# Patient Record
Sex: Female | Born: 1975
Health system: Southern US, Community
[De-identification: ages and names within clinical notes are randomized; demographics above are authoritative.]

## PROBLEM LIST (undated history)

## (undated) DIAGNOSIS — Z789 Other specified health status: Secondary | ICD-10-CM

## (undated) HISTORY — DX: Other specified health status: Z78.9

---

## 2001-01-03 HISTORY — PX: TUBAL LIGATION: SHX77

## 2005-10-31 ENCOUNTER — Emergency Department: Payer: Self-pay | Admitting: Emergency Medicine

## 2008-04-14 IMAGING — CR DG CHEST 2V
1 series · 2 of 2 positions shown · non-contrast
Comparison: none

REASON FOR EXAM: Cough
COMMENTS:

[Series 1: view not recorded · 0.17mm/px · 2 of 2 slices shown]
[im 1/2]
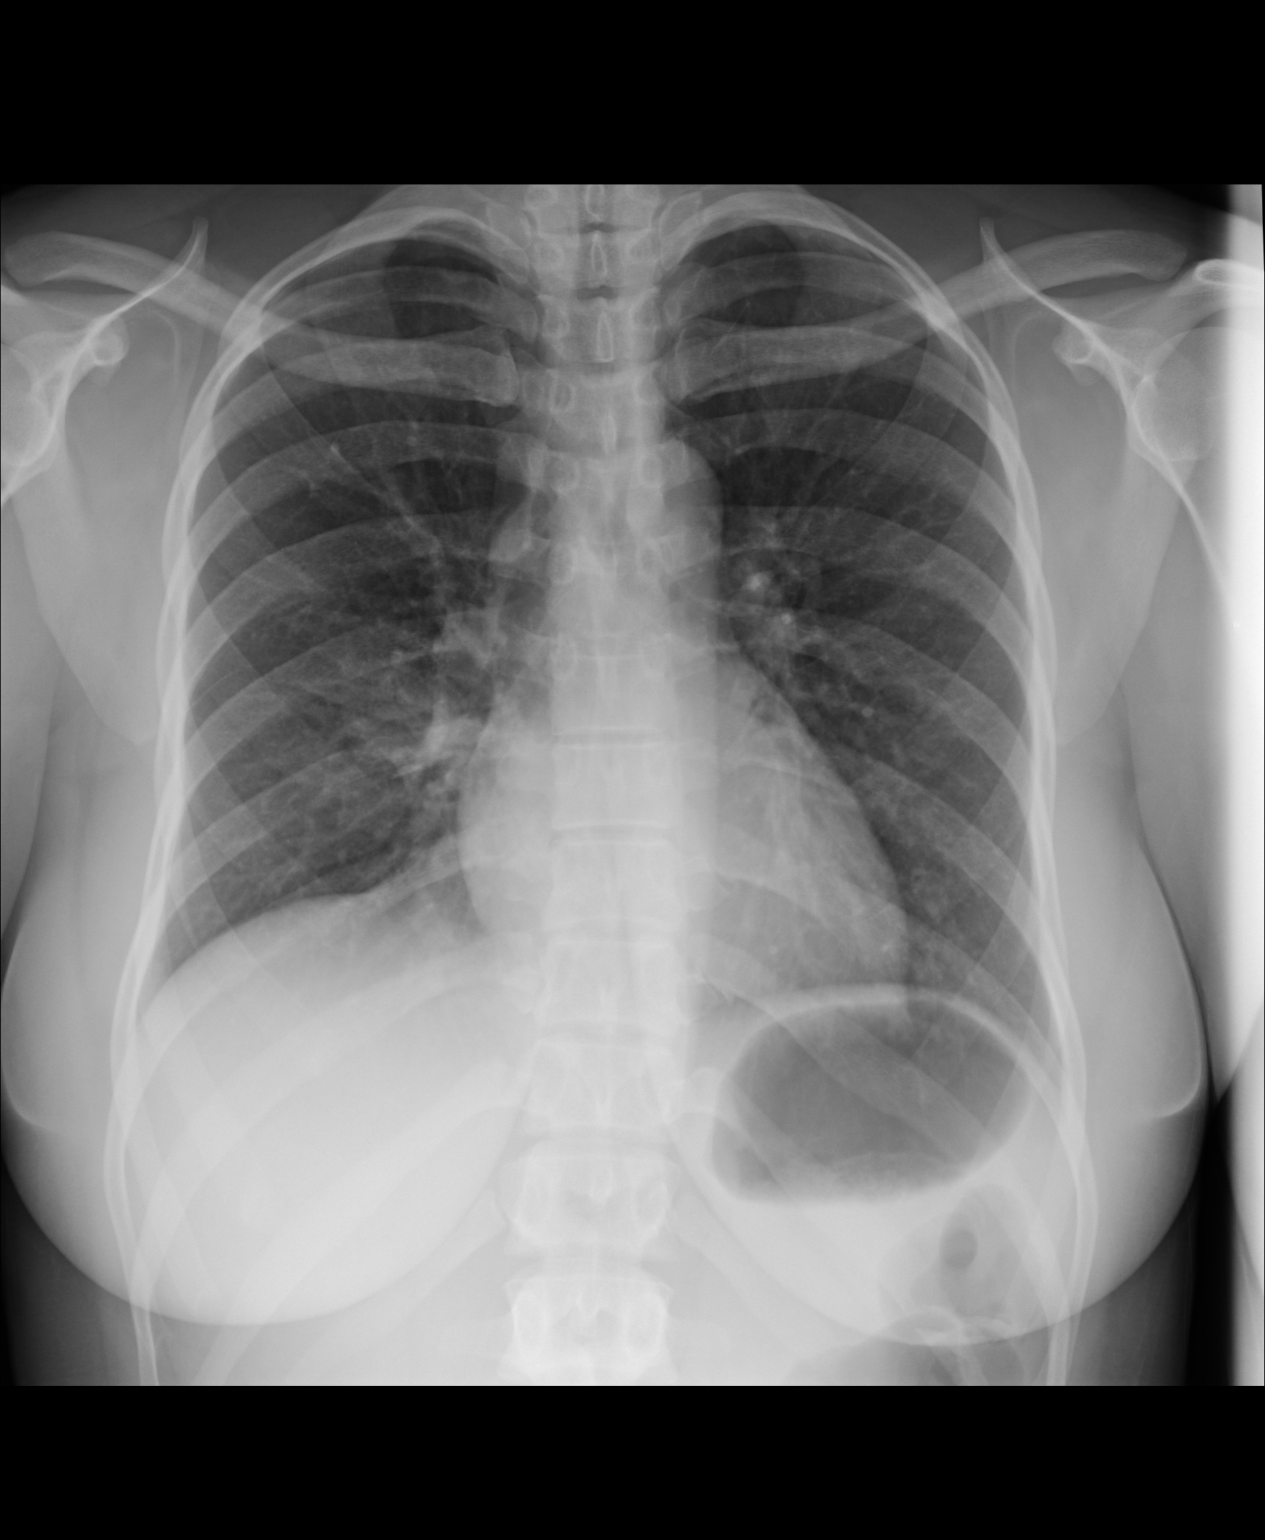
[im 2/2]
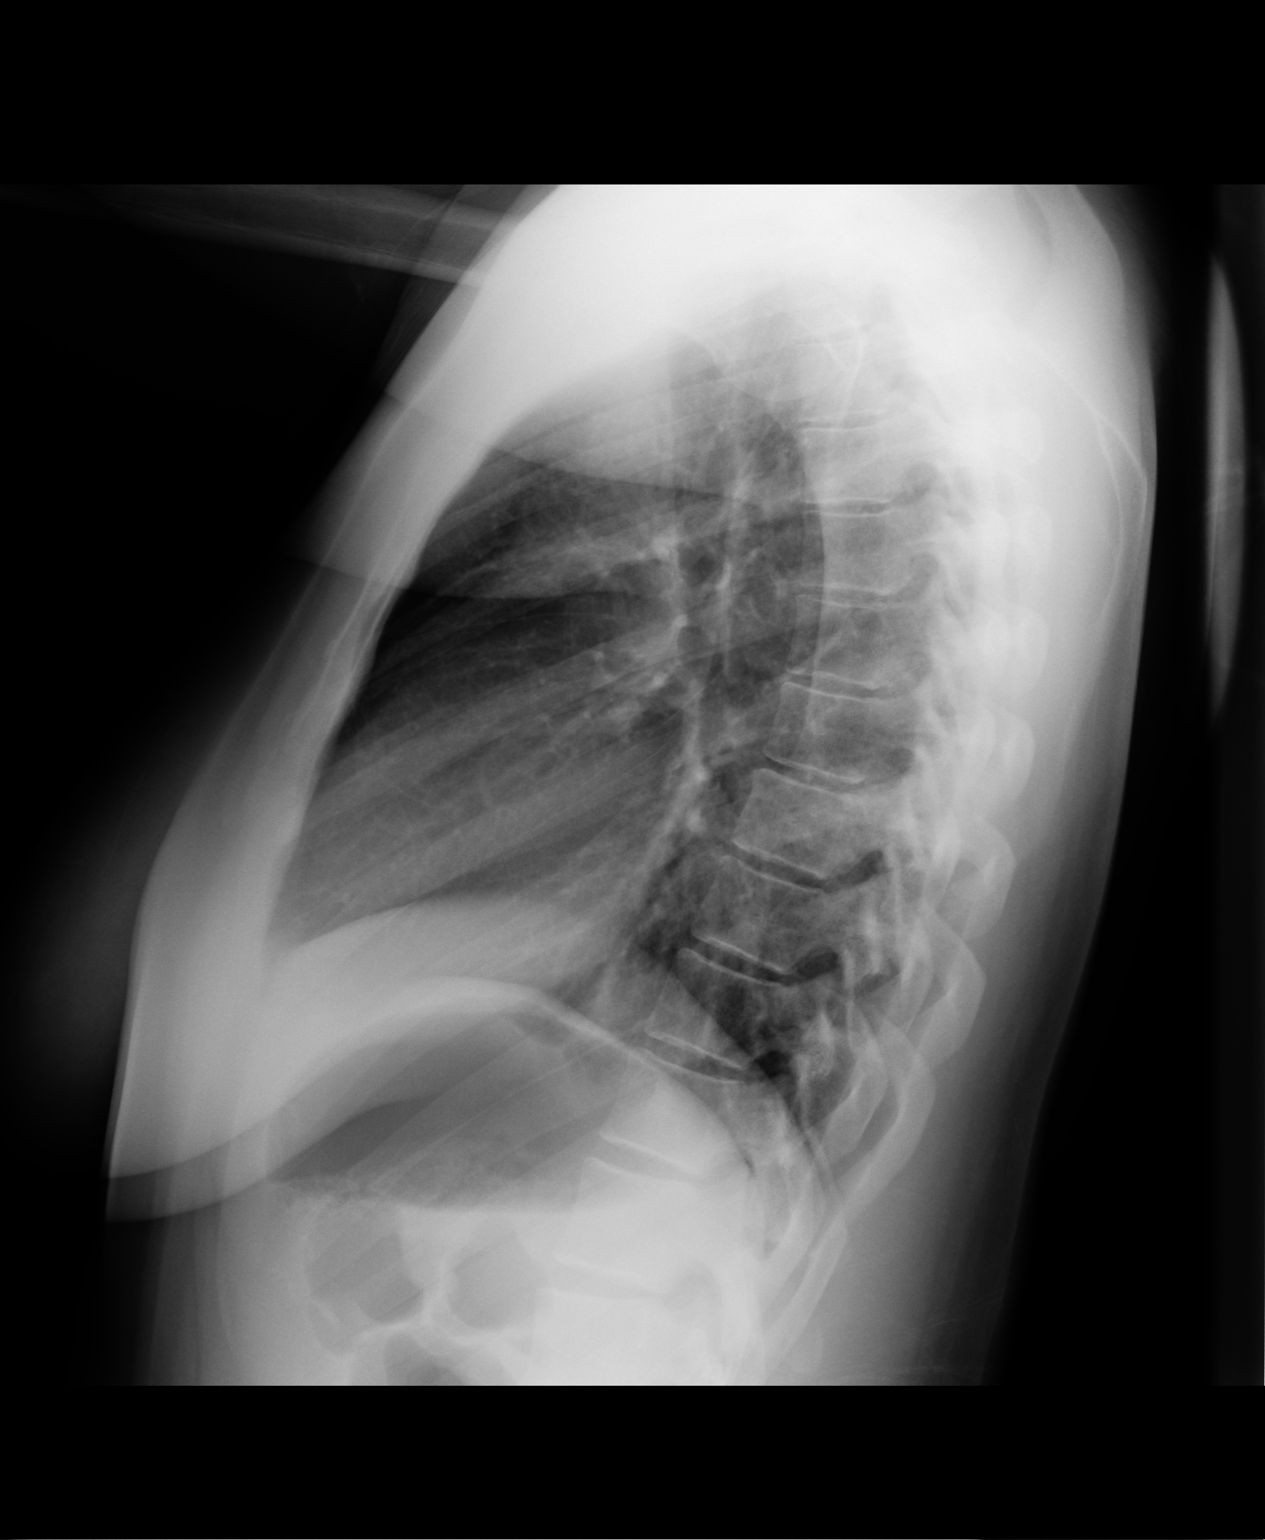

[2 of 2 positions shown; findings below may reference images not displayed]

PROCEDURE:     DXR - DXR CHEST PA (OR AP) AND LATERAL  - October 31, 2005  [DATE]

RESULT:     The patient has taken a shallow inspiration. With technique
taken into consideration, there is no evidence of focal infiltrates,
effusions or edema. The cardiac silhouette and visualized bony skeleton are
unremarkable.
IMPRESSION: Chest radiograph without evidence of acute cardiopulmonary
disease.

## 2008-12-05 ENCOUNTER — Emergency Department: Payer: Self-pay | Admitting: Emergency Medicine

## 2011-06-07 ENCOUNTER — Emergency Department: Payer: Self-pay | Admitting: Emergency Medicine

## 2012-06-03 ENCOUNTER — Emergency Department: Payer: Self-pay | Admitting: Emergency Medicine

## 2015-10-04 HISTORY — PX: OTHER SURGICAL HISTORY: SHX169

## 2016-01-04 NOTE — L&D Delivery Note (Signed)
Date of delivery: 09/08/2016 Estimated Date of Delivery: 09/06/16 Patient's last menstrual period was 11/15/2015 (approximate). EGA: 2725w2d  Delivery Note At 11:37 AM a viable female was delivered via Vaginal, Spontaneous Delivery (Presentation: OA;  LOA).  APGAR: 2, 9; weight 7 lb 13.6 oz (3560 g).   Placenta status: spontaneous, intact.  Cord:  with the following complications: none.  Cord pH: NA  Called to see patient.  Mom pushed to deliver a viable female infant.  The head followed by shoulders, with a 1 minute dystocia delivered with McRoberts and supra pubic pressure, and the rest of the body.  Nuchal cord noted and reduced following delivery of the body.  Baby to mom's chest. Baby appeared to be stunned following delivery. Stimulation and cord clamped and cut after < 1 min delay. Nursery MD called to room for newborn transition.  Cord blood obtained.  Placenta delivered spontaneously, intact, with a 3-vessel cord.  No lacerations.  All counts correct.  Hemostasis obtained with IV pitocin and fundal massage.   Anesthesia:  none Episiotomy:  none Lacerations: none  Suture Repair: NA Est. Blood Loss (mL):  250  Mom to postpartum.  Baby to Couplet care / Skin to Skin.  Tresea MallJane Brandi Tomlinson, CNM 09/08/2016, 12:02 PM

## 2016-03-18 ENCOUNTER — Encounter: Payer: Self-pay | Admitting: Advanced Practice Midwife

## 2016-03-18 ENCOUNTER — Ambulatory Visit (INDEPENDENT_AMBULATORY_CARE_PROVIDER_SITE_OTHER): Payer: Managed Care, Other (non HMO) | Admitting: Advanced Practice Midwife

## 2016-03-18 ENCOUNTER — Other Ambulatory Visit: Payer: Managed Care, Other (non HMO)

## 2016-03-18 VITALS — BP 102/62 | Ht 66.0 in | Wt 208.0 lb

## 2016-03-18 DIAGNOSIS — Z6831 Body mass index (BMI) 31.0-31.9, adult: Secondary | ICD-10-CM

## 2016-03-18 DIAGNOSIS — O99210 Obesity complicating pregnancy, unspecified trimester: Secondary | ICD-10-CM

## 2016-03-18 DIAGNOSIS — Z1379 Encounter for other screening for genetic and chromosomal anomalies: Secondary | ICD-10-CM

## 2016-03-18 DIAGNOSIS — Z113 Encounter for screening for infections with a predominantly sexual mode of transmission: Secondary | ICD-10-CM

## 2016-03-18 DIAGNOSIS — O09529 Supervision of elderly multigravida, unspecified trimester: Secondary | ICD-10-CM | POA: Insufficient documentation

## 2016-03-18 DIAGNOSIS — Z131 Encounter for screening for diabetes mellitus: Secondary | ICD-10-CM

## 2016-03-18 DIAGNOSIS — Z3A15 15 weeks gestation of pregnancy: Secondary | ICD-10-CM

## 2016-03-18 DIAGNOSIS — O0990 Supervision of high risk pregnancy, unspecified, unspecified trimester: Secondary | ICD-10-CM

## 2016-03-18 DIAGNOSIS — Z124 Encounter for screening for malignant neoplasm of cervix: Secondary | ICD-10-CM

## 2016-03-18 LAB — HM PAP SMEAR: HM Pap smear: NEGATIVE

## 2016-03-18 NOTE — Patient Instructions (Signed)

## 2016-03-18 NOTE — Progress Notes (Signed)
New Obstetric Patient H&P    Chief Complaint: "Desires prenatal care"   History of Present Illness: Patient is a 41 y.o. [id number] Not Hispanic or Latino female, LMP 11/15/2015 presents with amenorrhea and positive home pregnancy test. Based on early U/S, her EDD is Estimated Date of Delivery: 09/06/16 and her EGA is [redacted]w[redacted]d. Cycles are 3. days, regular, and occur approximately every : 28 days. Her last pap smear was 2 years ago and was no abnormalities. The patient had a reversal procedure of her tubal sterilization in October of last year and then conceived the following month. She had early care with the reproductive clinic in [place] where they confirmed the intrauterine pregnancy.   She had a urine pregnancy test which was positive 3 month(s)  ago. Her last menstrual period was normal and lasted for  3 day(s). Since her LMP she claims she has experienced fatigue and nausea. She denies vaginal bleeding. Her past medical history is noncontributory. Her prior pregnancies are notable for none  Since her LMP, she admits to the use of tobacco products  no She claims she has gained   yes, started at 195 pounds since the start of her pregnancy.  There are cats in the home in the home  no  She admits close contact with children on a regular basis  yes  She has had chicken pox in the past yes She has had Tuberculosis exposures, symptoms, or previously tested positive for TB   no Current or past history of domestic violence. no  Genetic Screening/Teratology Counseling: (Includes patient, baby's father, or anyone in either family with:)   1. Patient's age >/= 80 at [facility]  yes 2. Thalassemia ([place], [place], Mediterranean, or Asian background): MCV<80  no 3. Neural tube defect (meningomyelocele, spina bifida, anencephaly)  no 4. Congenital heart defect  no  5. Down syndrome  Yes, cousin's child with Down S. 6. Tay-Sachs (Jewish, [place]  no 7. Canavan's Disease  no 8. Sickle cell disease  or trait (African)  no  9. Hemophilia or other blood disorders  no  10. Muscular dystrophy  no  11. Cystic fibrosis  no  12. Huntington's Chorea  no  13. Mental retardation/autism  no 14. Other inherited genetic or chromosomal disorder  no 15. Maternal metabolic disorder (DM, PKU, etc)  no 16. Patient or FOB with a child with a birth defect not listed above no  16a. Patient or FOB with a birth defect themselves no 17. Recurrent pregnancy loss, or stillbirth  no  18. Any medications since LMP other than prenatal vitamins (include vitamins, supplements, OTC meds, drugs, alcohol)  no 19. Any other genetic/environmental exposure to discuss  no  Infection History:   1. Lives with someone with TB or TB exposed  no  2. Patient or partner has history of genital herpes  no 3. Rash or viral illness since LMP  no 4. History of STI (GC, CT, HPV, syphilis, HIV)  no 5. History of recent travel :  no  Other pertinent information:  no     Review of Systems:10 point review of systems negative unless otherwise noted in HPI  Past Medical History:  History reviewed. No pertinent past medical history.  Past Surgical History:  Past Surgical History:  Procedure Laterality Date  . TUBAL LIGATION  2003  . tubal reversal  10/2015    Gynecologic History: No LMP recorded. Patient is pregnant.  Obstetric History: [id number]  Family History:  History  reviewed. No pertinent family history.  Social History:  Social History   Social History  . Marital status: Single    Spouse name: N/A  . Number of children: N/A  . Years of education: N/A   Occupational History  . Not on file.   Social History Main Topics  . Smoking status: Former Games developermoker  . Smokeless tobacco: Never Used  . Alcohol use Yes     Comment: Social  . Drug use: No  . Sexual activity: Yes    Birth control/ protection: None   Other Topics Concern  . Not on file   Social History Narrative  . No narrative on file     Allergies:  Allergies not on file  Medications: Prior to Admission medications   Not on File    Physical Exam Vitals: Blood pressure 102/62, height 5\' 6"  (1.676 m), weight 208 lb (94.3 kg).  General: NAD HEENT: normocephalic, anicteric Thyroid: no enlargement, no palpable nodules Pulmonary: No increased work of breathing, CTAB Cardiovascular: RRR, distal pulses 2+ Abdomen: NABS, soft, non-tender, non-distended.  Umbilicus without lesions.  No hepatomegaly, splenomegaly or masses palpable. No evidence of hernia  Genitourinary:  External: Normal external female genitalia.  Normal urethral meatus, normal  Bartholin's and Skene's glands.    Vagina: Normal vaginal mucosa, no evidence of prolapse.    Cervix: Grossly normal in appearance, no bleeding  Uterus: Enlarged, mobile, normal contour.  No CMT  Adnexa: ovaries non-enlarged, no adnexal masses  Rectal: deferred Extremities: no edema, erythema, or tenderness Neurologic: Grossly intact Psychiatric: mood appropriate, affect full   Assessment: 41 y.o. G4P3003 at 5550w3d presenting to initiate prenatal care  Plan: 1) Avoid alcoholic beverages. 2) Patient encouraged not to smoke.  3) Discontinue the use of all non-medicinal drugs and chemicals.  4) Take prenatal vitamins daily.  5) Nutrition, food safety (fish, cheese advisories, and high nitrite foods) and exercise discussed. 6) Hospital and practice style discussed with cross coverage system.  7) Genetic Screening, such as with 1st Trimester Screening, cell free fetal DNA, AFP testing, and Ultrasound, as well as with amniocentesis and CVS as appropriate, is discussed with patient. At the conclusion of today's visit patient requested genetic testing 8) Patient is asked about travel to areas at risk for the Zika virus, and counseled to avoid travel and exposure to mosquitoes or sexual partners who may have themselves been exposed to the virus. Testing is discussed, and will be  ordered as appropriate.   9) Prenatal labs done today including NOB panel, sickle cell, early 1 hr gtt, Paptima, Urine Cx, Panorama genetic screen 10) Return in 4 weeks for Anatomy scan and ROB   Tresea MallJane Daron Breeding, CNM

## 2016-03-20 LAB — URINE CULTURE

## 2016-03-21 LAB — RPR+RH+ABO+RUB AB+AB SCR+CB...
ANTIBODY SCREEN: NEGATIVE
HEMOGLOBIN: 12.2 g/dL (ref 11.1–15.9)
HIV Screen 4th Generation wRfx: NONREACTIVE
Hematocrit: 37.6 % (ref 34.0–46.6)
Hepatitis B Surface Ag: NEGATIVE
MCH: 27.1 pg (ref 26.6–33.0)
MCHC: 32.4 g/dL (ref 31.5–35.7)
MCV: 84 fL (ref 79–97)
Platelets: 321 10*3/uL (ref 150–379)
RBC: 4.5 x10E6/uL (ref 3.77–5.28)
RDW: 13.4 % (ref 12.3–15.4)
RPR Ser Ql: NONREACTIVE
Rh Factor: POSITIVE
Rubella Antibodies, IGG: 6.72 index (ref >0.99)
Varicella zoster IgG: 262 index (ref >165)
WBC: 11 10*3/uL — AB (ref 3.4–10.8)

## 2016-03-21 LAB — HEMOGLOBINOPATHY EVALUATION
HGB C: 0 %
HGB S: 0 %
HGB VARIANT: 0 %
Hemoglobin A2 Quantitation: 2.3 % (ref 1.8–3.2)
Hemoglobin F Quantitation: 0 % (ref 0.0–2.0)
Hgb A: 97.7 % (ref 96.4–98.8)

## 2016-03-21 LAB — GLUCOSE, 1 HOUR GESTATIONAL: GESTATIONAL DIABETES SCREEN: 91 mg/dL (ref 65–139)

## 2016-03-22 LAB — IGP,CTNGTV,RFX APTIMA HPV ASCU
CHLAMYDIA, NUC. ACID AMP: NEGATIVE
Gonococcus, Nuc. Acid Amp: NEGATIVE
PAP SMEAR COMMENT: 0
Trich vag by NAA: NEGATIVE

## 2016-03-28 ENCOUNTER — Telehealth: Payer: Self-pay

## 2016-03-28 NOTE — Telephone Encounter (Signed)
Pt calling for results - boy or girl. [phone].  Left detailed msg that it take 7-10 business days to get results back and today is day six.  Hopefully we'll have them by the end of the week.

## 2016-04-04 NOTE — Telephone Encounter (Signed)
Pt calling again today for results. Did not see them in chart so requested from [place] with Avelina Laine. Results re-faxed and received as no results due to the limitations of the test algorithm which occurs in a small number of samples. A repeat test may be submitted. Please advise pt regarding this result and how/when she needs to retest. cb# [phone] thank you

## 2016-04-05 NOTE — Telephone Encounter (Signed)
Spoke with patient and plan is to repeat test when she comes in for her next appointment.

## 2016-04-15 ENCOUNTER — Other Ambulatory Visit: Payer: Self-pay | Admitting: Advanced Practice Midwife

## 2016-04-15 ENCOUNTER — Ambulatory Visit (INDEPENDENT_AMBULATORY_CARE_PROVIDER_SITE_OTHER): Payer: Managed Care, Other (non HMO) | Admitting: Advanced Practice Midwife

## 2016-04-15 ENCOUNTER — Ambulatory Visit (INDEPENDENT_AMBULATORY_CARE_PROVIDER_SITE_OTHER): Payer: Managed Care, Other (non HMO)

## 2016-04-15 VITALS — BP 118/76 | Wt 209.0 lb

## 2016-04-15 DIAGNOSIS — Z0489 Encounter for examination and observation for other specified reasons: Secondary | ICD-10-CM

## 2016-04-15 DIAGNOSIS — Z3A19 19 weeks gestation of pregnancy: Secondary | ICD-10-CM

## 2016-04-15 DIAGNOSIS — IMO0002 Reserved for concepts with insufficient information to code with codable children: Secondary | ICD-10-CM

## 2016-04-15 DIAGNOSIS — Z369 Encounter for antenatal screening, unspecified: Secondary | ICD-10-CM | POA: Diagnosis not present

## 2016-04-15 DIAGNOSIS — Z3492 Encounter for supervision of normal pregnancy, unspecified, second trimester: Secondary | ICD-10-CM

## 2016-04-15 DIAGNOSIS — Z362 Encounter for other antenatal screening follow-up: Secondary | ICD-10-CM

## 2016-04-15 NOTE — Progress Notes (Signed)
Anatomy scan today

## 2016-04-15 NOTE — Progress Notes (Signed)
Doing well. Has question about panorama and if a second specimen is sent will she receive a second bill. Spoke with rep from [place] who said that pt will not receive another bill if "REDRAW" is written on the requisition. Pt chooses to send another specimen. Anatomy scan today incomplete for heart, face, gender. F/U anatomy nv.

## 2016-05-13 ENCOUNTER — Encounter: Payer: Managed Care, Other (non HMO) | Admitting: Advanced Practice Midwife

## 2016-05-13 ENCOUNTER — Other Ambulatory Visit: Payer: Managed Care, Other (non HMO)

## 2016-05-24 ENCOUNTER — Telehealth: Payer: Self-pay

## 2016-05-24 NOTE — Telephone Encounter (Signed)
FMLA/DISABILITY form for [employer] filled out and given to [place] for processing. 

## 2016-05-27 ENCOUNTER — Ambulatory Visit (INDEPENDENT_AMBULATORY_CARE_PROVIDER_SITE_OTHER): Payer: Managed Care, Other (non HMO) | Admitting: Advanced Practice Midwife

## 2016-05-27 ENCOUNTER — Ambulatory Visit: Payer: Managed Care, Other (non HMO)

## 2016-05-27 VITALS — BP 112/68 | Wt 212.0 lb

## 2016-05-27 DIAGNOSIS — IMO0002 Reserved for concepts with insufficient information to code with codable children: Secondary | ICD-10-CM

## 2016-05-27 DIAGNOSIS — Z130 Encounter for screening for diseases of the blood and blood-forming organs and certain disorders involving the immune mechanism: Secondary | ICD-10-CM

## 2016-05-27 DIAGNOSIS — Z131 Encounter for screening for diabetes mellitus: Secondary | ICD-10-CM

## 2016-05-27 DIAGNOSIS — Z3A25 25 weeks gestation of pregnancy: Secondary | ICD-10-CM

## 2016-05-27 DIAGNOSIS — Z0489 Encounter for examination and observation for other specified reasons: Secondary | ICD-10-CM

## 2016-05-27 DIAGNOSIS — Z113 Encounter for screening for infections with a predominantly sexual mode of transmission: Secondary | ICD-10-CM

## 2016-05-27 NOTE — Progress Notes (Signed)
Anatomy Scan today, It is a BOY

## 2016-05-27 NOTE — Progress Notes (Signed)
Anatomy f/u today is complete. Doing well. Good fetal movement. No LOF, VB, Ctxs. 28 week labs nv.

## 2016-06-14 ENCOUNTER — Telehealth: Payer: Self-pay

## 2016-06-14 NOTE — Telephone Encounter (Signed)
Suzie from [facility] calling for pt.  Pt is 28wks preg and [facility] free breast pump.  Fax# [phone]

## 2016-06-17 ENCOUNTER — Other Ambulatory Visit: Payer: Managed Care, Other (non HMO)

## 2016-06-17 ENCOUNTER — Telehealth: Payer: Self-pay

## 2016-06-17 ENCOUNTER — Ambulatory Visit (INDEPENDENT_AMBULATORY_CARE_PROVIDER_SITE_OTHER): Payer: Managed Care, Other (non HMO) | Admitting: Obstetrics & Gynecology

## 2016-06-17 VITALS — BP 120/80 | Wt 211.0 lb

## 2016-06-17 DIAGNOSIS — O09529 Supervision of elderly multigravida, unspecified trimester: Secondary | ICD-10-CM

## 2016-06-17 DIAGNOSIS — O0990 Supervision of high risk pregnancy, unspecified, unspecified trimester: Secondary | ICD-10-CM

## 2016-06-17 DIAGNOSIS — Z113 Encounter for screening for infections with a predominantly sexual mode of transmission: Secondary | ICD-10-CM

## 2016-06-17 DIAGNOSIS — Z130 Encounter for screening for diseases of the blood and blood-forming organs and certain disorders involving the immune mechanism: Secondary | ICD-10-CM

## 2016-06-17 DIAGNOSIS — O99210 Obesity complicating pregnancy, unspecified trimester: Secondary | ICD-10-CM

## 2016-06-17 DIAGNOSIS — Z3A28 28 weeks gestation of pregnancy: Secondary | ICD-10-CM

## 2016-06-17 DIAGNOSIS — Z131 Encounter for screening for diabetes mellitus: Secondary | ICD-10-CM

## 2016-06-17 NOTE — Telephone Encounter (Signed)
Ok

## 2016-06-17 NOTE — Telephone Encounter (Signed)
Form faxed

## 2016-06-17 NOTE — Progress Notes (Signed)
PNV, Glucola today, Labor and preeclampsia precautions discussed. Breats feeding and abstinance

## 2016-06-17 NOTE — Telephone Encounter (Signed)
Kristen LeeSabrina c Phelps calling on behalf of pt to get rx [facility]  Rx need to have on it: pt's name, dob, member ID#, address, phone #, EDD.  The model she would like is [employer].  Fax to [phone], phone #[phone]. Paediatric nurse([facility])   Phelps # [phone]

## 2016-06-17 NOTE — Patient Instructions (Signed)
Third Trimester of Pregnancy The third trimester is from week 28 through week 40 (months 7 through 9). The third trimester is a time when the unborn baby (fetus) is growing rapidly. At the end of the ninth month, the fetus is about 20 inches in length and weighs 6-10 pounds. Body changes during your third trimester Your body will continue to go through many changes during pregnancy. The changes vary from woman to woman. During the third trimester:  Your weight will continue to increase. You can expect to gain 25-35 pounds (11-16 kg) by the end of the pregnancy.  You may begin to get stretch marks on your hips, abdomen, and breasts.  You may urinate more often because the fetus is moving lower into your pelvis and pressing on your bladder.  You may develop or continue to have heartburn. This is caused by increased hormones that slow down muscles in the digestive tract.  You may develop or continue to have constipation because increased hormones slow digestion and cause the muscles that push waste through your intestines to relax.  You may develop hemorrhoids. These are swollen veins (varicose veins) in the rectum that can itch or be painful.  You may develop swollen, bulging veins (varicose veins) in your legs.  You may have increased body aches in the pelvis, back, or thighs. This is due to weight gain and increased hormones that are relaxing your joints.  You may have changes in your hair. These can include thickening of your hair, rapid growth, and changes in texture. Some women also have hair loss during or after pregnancy, or hair that feels dry or thin. Your hair will most likely return to normal after your baby is born.  Your breasts will continue to grow and they will continue to become tender. A yellow fluid (colostrum) may leak from your breasts. This is the first milk you are producing for your baby.  Your belly button may stick out.  You may notice more swelling in your hands,  face, or ankles.  You may have increased tingling or numbness in your hands, arms, and legs. The skin on your belly may also feel numb.  You may feel short of breath because of your expanding uterus.  You may have more problems sleeping. This can be caused by the size of your belly, increased need to urinate, and an increase in your body's metabolism.  You may notice the fetus "dropping," or moving lower in your abdomen (lightening).  You may have increased vaginal discharge.  You may notice your joints feel loose and you may have pain around your pelvic bone.  What to expect at prenatal visits You will have prenatal exams every 2 weeks until week 36. Then you will have weekly prenatal exams. During a routine prenatal visit:  You will be weighed to make sure you and the baby are growing normally.  Your blood pressure will be taken.  Your abdomen will be measured to track your baby's growth.  The fetal heartbeat will be listened to.  Any test results from the previous visit will be discussed.  You may have a cervical check near your due date to see if your cervix has softened or thinned (effaced).  You will be tested for Group B streptococcus. This happens between 35 and 37 weeks.  Your health care provider may ask you:  What your birth plan is.  How you are feeling.  If you are feeling the baby move.  If you have had   any abnormal symptoms, such as leaking fluid, bleeding, severe headaches, or abdominal cramping.  If you are using any tobacco products, including cigarettes, chewing tobacco, and electronic cigarettes.  If you have any questions.  Other tests or screenings that may be performed during your third trimester include:  Blood tests that check for low iron levels (anemia).  Fetal testing to check the health, activity level, and growth of the fetus. Testing is done if you have certain medical conditions or if there are problems during the  pregnancy.  Nonstress test (NST). This test checks the health of your baby to make sure there are no signs of problems, such as the baby not getting enough oxygen. During this test, a belt is placed around your belly. The baby is made to move, and its heart rate is monitored during movement.  What is false labor? False labor is a condition in which you feel small, irregular tightenings of the muscles in the womb (contractions) that usually go away with rest, changing position, or drinking water. These are called Braxton Hicks contractions. Contractions may last for hours, days, or even weeks before true labor sets in. If contractions come at regular intervals, become more frequent, increase in intensity, or become painful, you should see your health care provider. What are the signs of labor?  Abdominal cramps.  Regular contractions that start at 10 minutes apart and become stronger and more frequent with time.  Contractions that start on the top of the uterus and spread down to the lower abdomen and back.  Increased pelvic pressure and dull back pain.  A watery or bloody mucus discharge that comes from the vagina.  Leaking of amniotic fluid. This is also known as your "water breaking." It could be a slow trickle or a gush. Let your health care provider know if it has a color or strange odor. If you have any of these signs, call your health care provider right away, even if it is before your due date. Follow these instructions at home: Medicines  Follow your health care provider's instructions regarding medicine use. Specific medicines may be either safe or unsafe to take during pregnancy.  Take a prenatal vitamin that contains at least 600 micrograms (mcg) of folic acid.  If you develop constipation, try taking a stool softener if your health care provider approves. Eating and drinking  Eat a balanced diet that includes fresh fruits and vegetables, whole grains, good sources of protein  such as meat, eggs, or tofu, and low-fat dairy. Your health care provider will help you determine the amount of weight gain that is right for you.  Avoid raw meat and uncooked cheese. These carry germs that can cause birth defects in the baby.  If you have low calcium intake from food, talk to your health care provider about whether you should take a daily calcium supplement.  Eat four or five small meals rather than three large meals a day.  Limit foods that are high in fat and processed sugars, such as fried and sweet foods.  To prevent constipation: ? Drink enough fluid to keep your urine clear or pale yellow. ? Eat foods that are high in fiber, such as fresh fruits and vegetables, whole grains, and beans. Activity  Exercise only as directed by your health care provider. Most women can continue their usual exercise routine during pregnancy. Try to exercise for 30 minutes at least 5 days a week. Stop exercising if you experience uterine contractions.  Avoid heavy   lifting.  Do not exercise in extreme heat or humidity, or at high altitudes.  Wear low-heel, comfortable shoes.  Practice good posture.  You may continue to have sex unless your health care provider tells you otherwise. Relieving pain and discomfort  Take frequent breaks and rest with your legs elevated if you have leg cramps or low back pain.  Take warm sitz baths to soothe any pain or discomfort caused by hemorrhoids. Use hemorrhoid cream if your health care provider approves.  Wear a good support bra to prevent discomfort from breast tenderness.  If you develop varicose veins: ? Wear support pantyhose or compression stockings as told by your healthcare provider. ? Elevate your feet for 15 minutes, 3-4 times a day. Prenatal care  Write down your questions. Take them to your prenatal visits.  Keep all your prenatal visits as told by your health care provider. This is important. Safety  Wear your seat belt at  all times when driving.  Make a list of emergency phone numbers, including numbers for family, friends, the hospital, and police and fire departments. General instructions  Avoid cat litter boxes and soil used by cats. These carry germs that can cause birth defects in the baby. If you have a cat, ask someone to clean the litter box for you.  Do not travel far distances unless it is absolutely necessary and only with the approval of your health care provider.  Do not use hot tubs, steam rooms, or saunas.  Do not drink alcohol.  Do not use any products that contain nicotine or tobacco, such as cigarettes and e-cigarettes. If you need help quitting, ask your health care provider.  Do not use any medicinal herbs or unprescribed drugs. These chemicals affect the formation and growth of the baby.  Do not douche or use tampons or scented sanitary pads.  Do not cross your legs for long periods of time.  To prepare for the arrival of your baby: ? Take prenatal classes to understand, practice, and ask questions about labor and delivery. ? Make a trial run to the hospital. ? Visit the hospital and tour the maternity area. ? Arrange for maternity or paternity leave through employers. ? Arrange for family and friends to take care of pets while you are in the hospital. ? Purchase a rear-facing car seat and make sure you know how to install it in your car. ? Pack your hospital bag. ? Prepare the baby's nursery. Make sure to remove all pillows and stuffed animals from the baby's crib to prevent suffocation.  Visit your dentist if you have not gone during your pregnancy. Use a soft toothbrush to brush your teeth and be gentle when you floss. Contact a health care provider if:  You are unsure if you are in labor or if your water has broken.  You become dizzy.  You have mild pelvic cramps, pelvic pressure, or nagging pain in your abdominal area.  You have lower back pain.  You have persistent  nausea, vomiting, or diarrhea.  You have an unusual or bad smelling vaginal discharge.  You have pain when you urinate. Get help right away if:  Your water breaks before 37 weeks.  You have regular contractions less than 5 minutes apart before 37 weeks.  You have a fever.  You are leaking fluid from your vagina.  You have spotting or bleeding from your vagina.  You have severe abdominal pain or cramping.  You have rapid weight loss or weight gain.    You have shortness of breath with chest pain.  You notice sudden or extreme swelling of your face, hands, ankles, feet, or legs.  Your baby makes fewer than 10 movements in 2 hours.  You have severe headaches that do not go away when you take medicine.  You have vision changes. Summary  The third trimester is from week 28 through week 40, months 7 through 9. The third trimester is a time when the unborn baby (fetus) is growing rapidly.  During the third trimester, your discomfort may increase as you and your baby continue to gain weight. You may have abdominal, leg, and back pain, sleeping problems, and an increased need to urinate.  During the third trimester your breasts will keep growing and they will continue to become tender. A yellow fluid (colostrum) may leak from your breasts. This is the first milk you are producing for your baby.  False labor is a condition in which you feel small, irregular tightenings of the muscles in the womb (contractions) that eventually go away. These are called Braxton Hicks contractions. Contractions may last for hours, days, or even weeks before true labor sets in.  Signs of labor can include: abdominal cramps; regular contractions that start at 10 minutes apart and become stronger and more frequent with time; watery or bloody mucus discharge that comes from the vagina; increased pelvic pressure and dull back pain; and leaking of amniotic fluid. This information is not intended to replace advice  given to you by your health care provider. Make sure you discuss any questions you have with your health care provider. Document Released: 12/14/2000 Document Revised: 05/28/2015 Document Reviewed: 02/21/2012 Elsevier Interactive Patient Education  2017 [employer]vier Inc.  

## 2016-06-18 LAB — 28 WEEK RH+PANEL
BASOS ABS: 0 10*3/uL (ref 0.0–0.2)
BASOS: 0 %
EOS (ABSOLUTE): 0.1 10*3/uL (ref 0.0–0.4)
EOS: 1 %
GESTATIONAL DIABETES SCREEN: 132 mg/dL (ref 65–139)
HIV Screen 4th Generation wRfx: NONREACTIVE
Hematocrit: 33.8 % — ABNORMAL LOW (ref 34.0–46.6)
Hemoglobin: 10.9 g/dL — ABNORMAL LOW (ref 11.1–15.9)
Immature Grans (Abs): 0.1 10*3/uL (ref 0.0–0.1)
Immature Granulocytes: 1 %
LYMPHS ABS: 2.3 10*3/uL (ref 0.7–3.1)
Lymphs: 20 %
MCH: 26.8 pg (ref 26.6–33.0)
MCHC: 32.2 g/dL (ref 31.5–35.7)
MCV: 83 fL (ref 79–97)
MONOS ABS: 0.7 10*3/uL (ref 0.1–0.9)
Monocytes: 6 %
Neutrophils Absolute: 8.3 10*3/uL — ABNORMAL HIGH (ref 1.4–7.0)
Neutrophils: 72 %
PLATELETS: 298 10*3/uL (ref 150–379)
RBC: 4.06 x10E6/uL (ref 3.77–5.28)
RDW: 14.6 % (ref 12.3–15.4)
RPR: NONREACTIVE
WBC: 11.5 10*3/uL — AB (ref 3.4–10.8)

## 2016-06-21 NOTE — Telephone Encounter (Signed)
Is this being taken care of? I am not sure from the conversation if I am supposed to do anything or if you are taking care of it now? Thanks

## 2016-06-22 NOTE — Telephone Encounter (Signed)
Yes [facility] took care of this

## 2016-07-01 ENCOUNTER — Ambulatory Visit (INDEPENDENT_AMBULATORY_CARE_PROVIDER_SITE_OTHER): Payer: Managed Care, Other (non HMO) | Admitting: Advanced Practice Midwife

## 2016-07-01 VITALS — BP 120/80 | Wt 212.0 lb

## 2016-07-01 DIAGNOSIS — Z3689 Encounter for other specified antenatal screening: Secondary | ICD-10-CM

## 2016-07-01 DIAGNOSIS — Z3A30 30 weeks gestation of pregnancy: Secondary | ICD-10-CM

## 2016-07-01 DIAGNOSIS — Z23 Encounter for immunization: Secondary | ICD-10-CM

## 2016-07-01 DIAGNOSIS — O09529 Supervision of elderly multigravida, unspecified trimester: Secondary | ICD-10-CM

## 2016-07-01 NOTE — Addendum Note (Signed)
Addended by: Cornelius MorasPATTERSON, Loise Esguerra D on: 07/01/2016 03:20 PM   Modules accepted: Orders

## 2016-07-01 NOTE — Progress Notes (Signed)
Doing well. Good fetal movement and adequate hydration. No LOF, VB, Ctx's. Reviewed AMA increased fetal surveillance protocol. Growth scan nv.

## 2016-07-18 ENCOUNTER — Ambulatory Visit (INDEPENDENT_AMBULATORY_CARE_PROVIDER_SITE_OTHER): Payer: Managed Care, Other (non HMO) | Admitting: Advanced Practice Midwife

## 2016-07-18 ENCOUNTER — Ambulatory Visit (INDEPENDENT_AMBULATORY_CARE_PROVIDER_SITE_OTHER): Payer: Managed Care, Other (non HMO)

## 2016-07-18 VITALS — BP 118/60 | Wt 210.0 lb

## 2016-07-18 DIAGNOSIS — O09529 Supervision of elderly multigravida, unspecified trimester: Secondary | ICD-10-CM | POA: Diagnosis not present

## 2016-07-18 DIAGNOSIS — Z3689 Encounter for other specified antenatal screening: Secondary | ICD-10-CM

## 2016-07-18 DIAGNOSIS — Z3A32 32 weeks gestation of pregnancy: Secondary | ICD-10-CM

## 2016-07-18 NOTE — Progress Notes (Signed)
Growth US today

## 2016-07-18 NOTE — Progress Notes (Signed)
Growth scan today baby is 4#6oz, 64%. AFI 14.03. Doing well, no complaints. No Ctx's, LOF, VB. RTC in 2 weeks for rob.

## 2016-08-05 ENCOUNTER — Ambulatory Visit (INDEPENDENT_AMBULATORY_CARE_PROVIDER_SITE_OTHER): Payer: Managed Care, Other (non HMO) | Admitting: Obstetrics and Gynecology

## 2016-08-05 VITALS — BP 116/70 | Wt 219.0 lb

## 2016-08-05 DIAGNOSIS — Z3A35 35 weeks gestation of pregnancy: Secondary | ICD-10-CM

## 2016-08-05 DIAGNOSIS — Z6831 Body mass index (BMI) 31.0-31.9, adult: Secondary | ICD-10-CM

## 2016-08-05 DIAGNOSIS — O09529 Supervision of elderly multigravida, unspecified trimester: Secondary | ICD-10-CM

## 2016-08-05 DIAGNOSIS — O0990 Supervision of high risk pregnancy, unspecified, unspecified trimester: Secondary | ICD-10-CM

## 2016-08-05 DIAGNOSIS — O99210 Obesity complicating pregnancy, unspecified trimester: Secondary | ICD-10-CM

## 2016-08-05 NOTE — Progress Notes (Signed)
Prenatal Visit Note Date: 08/05/2016 Clinic: [facility]  Subjective:  Kristen Phelps is a 41 y.o. [phone] at 6675w3d being seen today for ongoing prenatal care.  She is currently monitored for the following issues for this high-risk pregnancy and has Supervision of high risk pregnancy, antepartum; Advanced maternal age in multigravida, unspecified trimester; Obesity affecting pregnancy, antepartum; and BMI 31.0-31.9,adult on her problem list.   ----------------------------------------------------------------------------------- Patient reports no bleeding, no contractions and no leaking.   Contractions: Not present. Vag. Bleeding: None.  Movement: Present. Denies leaking of fluid.  ----------------------------------------------------------------------------------- The following portions of the patient's history were reviewed and updated as appropriate: allergies, current medications, past family history, past medical history, past social history, past surgical history and problem list. Problem list updated.  Objective:   Vitals:   08/05/16 1436  BP: 116/70  Weight: 219 lb (99.3 kg)   Total weight gain for pregnancy: 24 lb (10.9 kg)   Fetal Status: Fetal Heart Rate (bpm): 135 Fundal Height: 35 cm Movement: Present     General:  Alert, oriented and cooperative. Patient is in no acute distress.  Skin: Skin is warm and dry. No rash noted.   Cardiovascular: Normal heart rate noted  Respiratory: Normal respiratory effort, no problems with respiration noted  Abdomen: Soft, gravid, appropriate for gestational age. Pain/Pressure: Absent     Pelvic:  Cervical exam deferred        Extremities: Normal range of motion.     Mental Status: Normal mood and affect. Normal behavior. Normal judgment and thought content.   Urinalysis: Urine Protein: Negative Urine Glucose: Negative  Assessment and Plan:  Pregnancy: G4P3003 at 6375w3d  1. BMI 31.0-31.9,adult 2. Supervision of high risk pregnancy,  antepartum 3. Obesity affecting pregnancy, antepartum 4. Advanced maternal age in multigravida, unspecified trimester - start NSTs next week 5. [redacted] weeks gestation of pregnancy  Preterm labor symptoms and general obstetric precautions including but not limited to vaginal bleeding, contractions, leaking of fluid and fetal movement were reviewed in detail with the patient. Please refer to After Visit Summary for other counseling recommendations.   Return in about 1 week (around 08/12/2016) for routine prenatal and NST.  Thomasene MohairStephen Vanecia Limpert, MD 08/05/2016 3:44 PM

## 2016-08-15 ENCOUNTER — Ambulatory Visit (INDEPENDENT_AMBULATORY_CARE_PROVIDER_SITE_OTHER): Payer: Managed Care, Other (non HMO) | Admitting: Obstetrics & Gynecology

## 2016-08-15 VITALS — BP 120/80 | Wt 212.0 lb

## 2016-08-15 DIAGNOSIS — O09529 Supervision of elderly multigravida, unspecified trimester: Secondary | ICD-10-CM

## 2016-08-15 DIAGNOSIS — O99210 Obesity complicating pregnancy, unspecified trimester: Secondary | ICD-10-CM

## 2016-08-15 DIAGNOSIS — Z3A36 36 weeks gestation of pregnancy: Secondary | ICD-10-CM

## 2016-08-15 NOTE — Progress Notes (Signed)
PNV, FMC, Labor precautions A NST procedure was performed with FHR monitoring and a normal baseline established, appropriate time of 20-40 minutes of evaluation, and accels >2 seen w 15x15 characteristics.  Results show a REACTIVE NST.  GBS done

## 2016-08-15 NOTE — Addendum Note (Signed)
Addended by: Nadara MustardHARRIS, Jaidalyn Schillo P on: 08/15/2016 02:24 PM   Modules accepted: Orders

## 2016-08-18 LAB — CULTURE, BETA STREP (GROUP B ONLY): Strep Gp B Culture: POSITIVE — AB

## 2016-08-22 ENCOUNTER — Ambulatory Visit (INDEPENDENT_AMBULATORY_CARE_PROVIDER_SITE_OTHER): Payer: Managed Care, Other (non HMO)

## 2016-08-22 ENCOUNTER — Ambulatory Visit (INDEPENDENT_AMBULATORY_CARE_PROVIDER_SITE_OTHER): Payer: Managed Care, Other (non HMO) | Admitting: Maternal Newborn

## 2016-08-22 VITALS — BP 102/62 | Wt 211.0 lb

## 2016-08-22 DIAGNOSIS — O0990 Supervision of high risk pregnancy, unspecified, unspecified trimester: Secondary | ICD-10-CM

## 2016-08-22 DIAGNOSIS — O09529 Supervision of elderly multigravida, unspecified trimester: Secondary | ICD-10-CM

## 2016-08-22 DIAGNOSIS — Z362 Encounter for other antenatal screening follow-up: Secondary | ICD-10-CM

## 2016-08-22 DIAGNOSIS — Z6831 Body mass index (BMI) 31.0-31.9, adult: Secondary | ICD-10-CM

## 2016-08-22 DIAGNOSIS — O99210 Obesity complicating pregnancy, unspecified trimester: Secondary | ICD-10-CM

## 2016-08-22 DIAGNOSIS — Z3A37 37 weeks gestation of pregnancy: Secondary | ICD-10-CM | POA: Insufficient documentation

## 2016-08-22 LAB — FETAL NONSTRESS TEST

## 2016-08-22 NOTE — Progress Notes (Signed)
Pt c/o feeling like baby is "vibrating" in stomach. NST/AFI today.

## 2016-08-22 NOTE — Patient Instructions (Signed)
Third Trimester of Pregnancy The third trimester is from week 28 through week 40 (months 7 through 9). The third trimester is a time when the unborn baby (fetus) is growing rapidly. At the end of the ninth month, the fetus is about 20 inches in length and weighs 6-10 pounds. Body changes during your third trimester Your body will continue to go through many changes during pregnancy. The changes vary from woman to woman. During the third trimester:  Your weight will continue to increase. You can expect to gain 25-35 pounds (11-16 kg) by the end of the pregnancy.  You may begin to get stretch marks on your hips, abdomen, and breasts.  You may urinate more often because the fetus is moving lower into your pelvis and pressing on your bladder.  You may develop or continue to have heartburn. This is caused by increased hormones that slow down muscles in the digestive tract.  You may develop or continue to have constipation because increased hormones slow digestion and cause the muscles that push waste through your intestines to relax.  You may develop hemorrhoids. These are swollen veins (varicose veins) in the rectum that can itch or be painful.  You may develop swollen, bulging veins (varicose veins) in your legs.  You may have increased body aches in the pelvis, back, or thighs. This is due to weight gain and increased hormones that are relaxing your joints.  You may have changes in your hair. These can include thickening of your hair, rapid growth, and changes in texture. Some women also have hair loss during or after pregnancy, or hair that feels dry or thin. Your hair will most likely return to normal after your baby is born.  Your breasts will continue to grow and they will continue to become tender. A yellow fluid (colostrum) may leak from your breasts. This is the first milk you are producing for your baby.  Your belly button may stick out.  You may notice more swelling in your hands,  face, or ankles.  You may have increased tingling or numbness in your hands, arms, and legs. The skin on your belly may also feel numb.  You may feel short of breath because of your expanding uterus.  You may have more problems sleeping. This can be caused by the size of your belly, increased need to urinate, and an increase in your body's metabolism.  You may notice the fetus "dropping," or moving lower in your abdomen (lightening).  You may have increased vaginal discharge.  You may notice your joints feel loose and you may have pain around your pelvic bone.  What to expect at prenatal visits You will have prenatal exams every 2 weeks until week 36. Then you will have weekly prenatal exams. During a routine prenatal visit:  You will be weighed to make sure you and the baby are growing normally.  Your blood pressure will be taken.  Your abdomen will be measured to track your baby's growth.  The fetal heartbeat will be listened to.  Any test results from the previous visit will be discussed.  You may have a cervical check near your due date to see if your cervix has softened or thinned (effaced).  You will be tested for Group B streptococcus. This happens between 35 and 37 weeks.  Your health care provider may ask you:  What your birth plan is.  How you are feeling.  If you are feeling the baby move.  If you have had   any abnormal symptoms, such as leaking fluid, bleeding, severe headaches, or abdominal cramping.  If you are using any tobacco products, including cigarettes, chewing tobacco, and electronic cigarettes.  If you have any questions.  Other tests or screenings that may be performed during your third trimester include:  Blood tests that check for low iron levels (anemia).  Fetal testing to check the health, activity level, and growth of the fetus. Testing is done if you have certain medical conditions or if there are problems during the  pregnancy.  Nonstress test (NST). This test checks the health of your baby to make sure there are no signs of problems, such as the baby not getting enough oxygen. During this test, a belt is placed around your belly. The baby is made to move, and its heart rate is monitored during movement.  What is false labor? False labor is a condition in which you feel small, irregular tightenings of the muscles in the womb (contractions) that usually go away with rest, changing position, or drinking water. These are called Braxton Hicks contractions. Contractions may last for hours, days, or even weeks before true labor sets in. If contractions come at regular intervals, become more frequent, increase in intensity, or become painful, you should see your health care provider. What are the signs of labor?  Abdominal cramps.  Regular contractions that start at 10 minutes apart and become stronger and more frequent with time.  Contractions that start on the top of the uterus and spread down to the lower abdomen and back.  Increased pelvic pressure and dull back pain.  A watery or bloody mucus discharge that comes from the vagina.  Leaking of amniotic fluid. This is also known as your "water breaking." It could be a slow trickle or a gush. Let your health care provider know if it has a color or strange odor. If you have any of these signs, call your health care provider right away, even if it is before your due date. Follow these instructions at home: Medicines  Follow your health care provider's instructions regarding medicine use. Specific medicines may be either safe or unsafe to take during pregnancy.  Take a prenatal vitamin that contains at least 600 micrograms (mcg) of folic acid.  If you develop constipation, try taking a stool softener if your health care provider approves. Eating and drinking  Eat a balanced diet that includes fresh fruits and vegetables, whole grains, good sources of protein  such as meat, eggs, or tofu, and low-fat dairy. Your health care provider will help you determine the amount of weight gain that is right for you.  Avoid raw meat and uncooked cheese. These carry germs that can cause birth defects in the baby.  If you have low calcium intake from food, talk to your health care provider about whether you should take a daily calcium supplement.  Eat four or five small meals rather than three large meals a day.  Limit foods that are high in fat and processed sugars, such as fried and sweet foods.  To prevent constipation: ? Drink enough fluid to keep your urine clear or pale yellow. ? Eat foods that are high in fiber, such as fresh fruits and vegetables, whole grains, and beans. Activity  Exercise only as directed by your health care provider. Most women can continue their usual exercise routine during pregnancy. Try to exercise for 30 minutes at least 5 days a week. Stop exercising if you experience uterine contractions.  Avoid heavy   lifting.  Do not exercise in extreme heat or humidity, or at high altitudes.  Wear low-heel, comfortable shoes.  Practice good posture.  You may continue to have sex unless your health care provider tells you otherwise. Relieving pain and discomfort  Take frequent breaks and rest with your legs elevated if you have leg cramps or low back pain.  Take warm sitz baths to soothe any pain or discomfort caused by hemorrhoids. Use hemorrhoid cream if your health care provider approves.  Wear a good support bra to prevent discomfort from breast tenderness.  If you develop varicose veins: ? Wear support pantyhose or compression stockings as told by your healthcare provider. ? Elevate your feet for 15 minutes, 3-4 times a day. Prenatal care  Write down your questions. Take them to your prenatal visits.  Keep all your prenatal visits as told by your health care provider. This is important. Safety  Wear your seat belt at  all times when driving.  Make a list of emergency phone numbers, including numbers for family, friends, the hospital, and police and fire departments. General instructions  Avoid cat litter boxes and soil used by cats. These carry germs that can cause birth defects in the baby. If you have a cat, ask someone to clean the litter box for you.  Do not travel far distances unless it is absolutely necessary and only with the approval of your health care provider.  Do not use hot tubs, steam rooms, or saunas.  Do not drink alcohol.  Do not use any products that contain nicotine or tobacco, such as cigarettes and e-cigarettes. If you need help quitting, ask your health care provider.  Do not use any medicinal herbs or unprescribed drugs. These chemicals affect the formation and growth of the baby.  Do not douche or use tampons or scented sanitary pads.  Do not cross your legs for long periods of time.  To prepare for the arrival of your baby: ? Take prenatal classes to understand, practice, and ask questions about labor and delivery. ? Make a trial run to the hospital. ? Visit the hospital and tour the maternity area. ? Arrange for maternity or paternity leave through employers. ? Arrange for family and friends to take care of pets while you are in the hospital. ? Purchase a rear-facing car seat and make sure you know how to install it in your car. ? Pack your hospital bag. ? Prepare the baby's nursery. Make sure to remove all pillows and stuffed animals from the baby's crib to prevent suffocation.  Visit your dentist if you have not gone during your pregnancy. Use a soft toothbrush to brush your teeth and be gentle when you floss. Contact a health care provider if:  You are unsure if you are in labor or if your water has broken.  You become dizzy.  You have mild pelvic cramps, pelvic pressure, or nagging pain in your abdominal area.  You have lower back pain.  You have persistent  nausea, vomiting, or diarrhea.  You have an unusual or bad smelling vaginal discharge.  You have pain when you urinate. Get help right away if:  Your water breaks before 37 weeks.  You have regular contractions less than 5 minutes apart before 37 weeks.  You have a fever.  You are leaking fluid from your vagina.  You have spotting or bleeding from your vagina.  You have severe abdominal pain or cramping.  You have rapid weight loss or weight gain.    You have shortness of breath with chest pain.  You notice sudden or extreme swelling of your face, hands, ankles, feet, or legs.  Your baby makes fewer than 10 movements in 2 hours.  You have severe headaches that do not go away when you take medicine.  You have vision changes. Summary  The third trimester is from week 28 through week 40, months 7 through 9. The third trimester is a time when the unborn baby (fetus) is growing rapidly.  During the third trimester, your discomfort may increase as you and your baby continue to gain weight. You may have abdominal, leg, and back pain, sleeping problems, and an increased need to urinate.  During the third trimester your breasts will keep growing and they will continue to become tender. A yellow fluid (colostrum) may leak from your breasts. This is the first milk you are producing for your baby.  False labor is a condition in which you feel small, irregular tightenings of the muscles in the womb (contractions) that eventually go away. These are called Braxton Hicks contractions. Contractions may last for hours, days, or even weeks before true labor sets in.  Signs of labor can include: abdominal cramps; regular contractions that start at 10 minutes apart and become stronger and more frequent with time; watery or bloody mucus discharge that comes from the vagina; increased pelvic pressure and dull back pain; and leaking of amniotic fluid. This information is not intended to replace advice  given to you by your health care provider. Make sure you discuss any questions you have with your health care provider. Document Released: 12/14/2000 Document Revised: 05/28/2015 Document Reviewed: 02/21/2012 Elsevier Interactive Patient Education  2017 [employer]vier Inc.  

## 2016-08-22 NOTE — Progress Notes (Signed)
    Routine Prenatal Care Visit  Subjective  Kristen Phelps is a 41 y.o. [phone] at [redacted]w[redacted]d being seen today for ongoing prenatal care.  She is currently monitored for the following issues for this high-risk pregnancy and has Supervision of high risk pregnancy, antepartum; Advanced maternal age in multigravida, unspecified trimester; Obesity affecting pregnancy, antepartum; BMI 31.0-31.9,adult; and [redacted] weeks gestation of pregnancy on her problem list.  ----------------------------------------------------------------------------------- Patient reports occasional contractions.   Contractions: Irregular. Vag. Bleeding: None.  Movement: Present. Denies leaking of fluid.  ----------------------------------------------------------------------------------- The following portions of the patient's history were reviewed and updated as appropriate: allergies, current medications, past family history, past medical history, past social history, past surgical history and problem list. Problem list updated.   Objective  Blood pressure 102/62, weight 211 lb (95.7 kg). Pregravid weight 195 lb (88.5 kg) Total Weight Gain 16 lb (7.258 kg) Urinalysis: Urine Protein: Negative Urine Glucose: Negative  Fetal Status: Fetal Heart Rate (bpm): 130 Fundal Height: 38 cm Movement: Present  Presentation: Vertex  General:  Alert, oriented and cooperative. Patient is in no acute distress.  Skin: Skin is warm and dry. No rash noted.   Cardiovascular: Normal heart rate noted  Respiratory: Normal respiratory effort, no problems with respiration noted  Abdomen: Soft, gravid, appropriate for gestational age. Pain/Pressure: Absent     Pelvic:  Cervical exam performed Dilation: Closed Effacement (%): 40 Station: -3  Extremities: Normal range of motion.     Mental Status: Normal mood and affect. Normal behavior. Normal judgment and thought content.     Assessment   41 y.o. [id number] at [redacted]w[redacted]d by  09/06/2016, by Ultrasound  presenting for routine prenatal visit  Plan   1) AMA - NST today; baby very active and difficult to keep on monitor. I personally stayed with patient to establish baseline and appropriate number of 15X15 accelerations. NST reactive.  AFI = 12.15 cm.  2) GBS+ - Pt advised of result and need for antibiotic prophylaxis in labor.  Term labor symptoms and general obstetric precautions including but not limited to vaginal bleeding, contractions, leaking of fluid and fetal movement were reviewed in detail with the patient. Please refer to After Visit Summary for other counseling recommendations.   Return in about 7 days (around 08/29/2016) for ROB with NST.   Marcelyn Bruins, CNM

## 2016-08-25 ENCOUNTER — Other Ambulatory Visit: Payer: Self-pay | Admitting: Obstetrics & Gynecology

## 2016-08-25 DIAGNOSIS — O09523 Supervision of elderly multigravida, third trimester: Secondary | ICD-10-CM

## 2016-08-30 ENCOUNTER — Ambulatory Visit (INDEPENDENT_AMBULATORY_CARE_PROVIDER_SITE_OTHER): Payer: Managed Care, Other (non HMO)

## 2016-08-30 ENCOUNTER — Ambulatory Visit (INDEPENDENT_AMBULATORY_CARE_PROVIDER_SITE_OTHER): Payer: Managed Care, Other (non HMO) | Admitting: Obstetrics & Gynecology

## 2016-08-30 VITALS — BP 120/70 | Wt 212.0 lb

## 2016-08-30 DIAGNOSIS — O99210 Obesity complicating pregnancy, unspecified trimester: Secondary | ICD-10-CM

## 2016-08-30 DIAGNOSIS — Z362 Encounter for other antenatal screening follow-up: Secondary | ICD-10-CM | POA: Diagnosis not present

## 2016-08-30 DIAGNOSIS — O09523 Supervision of elderly multigravida, third trimester: Secondary | ICD-10-CM | POA: Diagnosis not present

## 2016-08-30 DIAGNOSIS — Z3A39 39 weeks gestation of pregnancy: Secondary | ICD-10-CM | POA: Diagnosis not present

## 2016-08-30 DIAGNOSIS — O09529 Supervision of elderly multigravida, unspecified trimester: Secondary | ICD-10-CM

## 2016-08-30 DIAGNOSIS — O0990 Supervision of high risk pregnancy, unspecified, unspecified trimester: Secondary | ICD-10-CM

## 2016-08-30 NOTE — Progress Notes (Signed)
PNV, FMC, Labor precautions. AMA RF discussed. IOL next week around 40 weeks bc of AMA risks.  NST and AFI repeat Tues. Plan IOL Sept 6 (scheduled). A NST procedure was performed with FHR monitoring and a normal baseline established, appropriate time of 20-40 minutes of evaluation, and accels >2 seen w 15x15 characteristics.  Results show a REACTIVE NST.  Review of ULTRASOUND.    I have personally reviewed images and report of recent ultrasound done at [facility].    Plan of management to be discussed with patient.

## 2016-09-06 ENCOUNTER — Ambulatory Visit (INDEPENDENT_AMBULATORY_CARE_PROVIDER_SITE_OTHER): Payer: Managed Care, Other (non HMO)

## 2016-09-06 ENCOUNTER — Ambulatory Visit (INDEPENDENT_AMBULATORY_CARE_PROVIDER_SITE_OTHER): Payer: Managed Care, Other (non HMO) | Admitting: Obstetrics and Gynecology

## 2016-09-06 VITALS — BP 116/76 | Wt 207.0 lb

## 2016-09-06 DIAGNOSIS — Z362 Encounter for other antenatal screening follow-up: Secondary | ICD-10-CM

## 2016-09-06 DIAGNOSIS — O99210 Obesity complicating pregnancy, unspecified trimester: Secondary | ICD-10-CM | POA: Diagnosis not present

## 2016-09-06 DIAGNOSIS — O09529 Supervision of elderly multigravida, unspecified trimester: Secondary | ICD-10-CM

## 2016-09-06 DIAGNOSIS — Z3A40 40 weeks gestation of pregnancy: Secondary | ICD-10-CM

## 2016-09-06 DIAGNOSIS — Z6831 Body mass index (BMI) 31.0-31.9, adult: Secondary | ICD-10-CM

## 2016-09-06 DIAGNOSIS — O0990 Supervision of high risk pregnancy, unspecified, unspecified trimester: Secondary | ICD-10-CM

## 2016-09-06 NOTE — Progress Notes (Signed)
    Routine Prenatal Care Visit  Subjective  Kristen Phelps is a 41 y.o. [phone] at 9112w0d being seen today for ongoing prenatal care.  She is currently monitored for the following issues for this high-risk pregnancy and has Supervision of high risk pregnancy, antepartum; Advanced maternal age in multigravida, unspecified trimester; Obesity affecting pregnancy, antepartum; and BMI 31.0-31.9,adult on her problem list.  ----------------------------------------------------------------------------------- Patient reports no complaints.   Contractions: Irregular. Vag. Bleeding: None.  Movement: Present. Denies leaking of fluid.  ----------------------------------------------------------------------------------- The following portions of the patient's history were reviewed and updated as appropriate: allergies, current medications, past family history, past medical history, past social history, past surgical history and problem list. Problem list updated.   Objective  Blood pressure 116/76, weight 207 lb (93.9 kg). Pregravid weight 195 lb (88.5 kg) Total Weight Gain 12 lb (5.443 kg) Urinalysis:      Fetal Status: Fetal Heart Rate (bpm): 140 Fundal Height: 39 cm Movement: Present  Presentation: Vertex  General:  Alert, oriented and cooperative. Patient is in no acute distress.  Skin: Skin is warm and dry. No rash noted.   Cardiovascular: Normal heart rate noted  Respiratory: Normal respiratory effort, no problems with respiration noted  Abdomen: Soft, gravid, appropriate for gestational age.       Pelvic:  Cervical exam performed        Extremities: Normal range of motion.     ental Status: Normal mood and affect. Normal behavior. Normal judgment and thought content.   AFI normal 7.03cm Baseline: 140 Variability: moderate Accelerations: present Decelerations: absent Tocometry: N/A The patient was monitored for 30 minutes, fetal heart rate tracing was deemed reactive, category I  tracing,   Assessment   41 y.o. G4W1027G4P3003 at 8012w0d by  09/06/2016, by Ultrasound presenting for routine prenatal visit  Plan   Pregnancy #4 Problems (from 12/02/15 to present)    Problem Noted Resolved   BMI 31.0-31.9,adult 08/05/2016 by Conard NovakJackson, Stephen D, MD No   Advanced maternal age in multigravida, unspecified trimester 03/18/2016 by Tresea MallGledhill, Jane, CNM No       Term labor symptoms and general obstetric precautions including but not limited to vaginal bleeding, contractions, leaking of fluid and fetal movement were reviewed in detail with the patient. Please refer to After Visit Summary for other counseling recommendations.   Return if symptoms worsen or fail to improve.

## 2016-09-06 NOTE — Progress Notes (Signed)
AFI/NST

## 2016-09-07 ENCOUNTER — Inpatient Hospital Stay
Admission: EM | Admit: 2016-09-07 | Discharge: 2016-09-09 | DRG: 775 | Disposition: A | Discharge status: Home / Self Care | Payer: Managed Care, Other (non HMO) | Attending: Obstetrics & Gynecology | Admitting: Obstetrics & Gynecology

## 2016-09-07 DIAGNOSIS — E669 Obesity, unspecified: Secondary | ICD-10-CM | POA: Diagnosis present

## 2016-09-07 DIAGNOSIS — Z87891 Personal history of nicotine dependence: Secondary | ICD-10-CM

## 2016-09-07 DIAGNOSIS — D62 Acute posthemorrhagic anemia: Secondary | ICD-10-CM | POA: Diagnosis not present

## 2016-09-07 DIAGNOSIS — O99214 Obesity complicating childbirth: Secondary | ICD-10-CM | POA: Diagnosis present

## 2016-09-07 DIAGNOSIS — O99824 Streptococcus B carrier state complicating childbirth: Secondary | ICD-10-CM | POA: Diagnosis present

## 2016-09-07 DIAGNOSIS — O9081 Anemia of the puerperium: Secondary | ICD-10-CM | POA: Diagnosis not present

## 2016-09-07 DIAGNOSIS — Z3A40 40 weeks gestation of pregnancy: Secondary | ICD-10-CM

## 2016-09-07 DIAGNOSIS — Z6833 Body mass index (BMI) 33.0-33.9, adult: Secondary | ICD-10-CM

## 2016-09-08 ENCOUNTER — Encounter: Payer: Self-pay | Admitting: Obstetrics and Gynecology

## 2016-09-08 DIAGNOSIS — O99214 Obesity complicating childbirth: Secondary | ICD-10-CM | POA: Diagnosis present

## 2016-09-08 DIAGNOSIS — Z3A40 40 weeks gestation of pregnancy: Secondary | ICD-10-CM | POA: Diagnosis not present

## 2016-09-08 DIAGNOSIS — Z3493 Encounter for supervision of normal pregnancy, unspecified, third trimester: Secondary | ICD-10-CM | POA: Diagnosis present

## 2016-09-08 DIAGNOSIS — E669 Obesity, unspecified: Secondary | ICD-10-CM | POA: Diagnosis present

## 2016-09-08 DIAGNOSIS — D62 Acute posthemorrhagic anemia: Secondary | ICD-10-CM | POA: Diagnosis not present

## 2016-09-08 DIAGNOSIS — Z87891 Personal history of nicotine dependence: Secondary | ICD-10-CM | POA: Diagnosis not present

## 2016-09-08 DIAGNOSIS — O99824 Streptococcus B carrier state complicating childbirth: Secondary | ICD-10-CM | POA: Diagnosis present

## 2016-09-08 DIAGNOSIS — Z6833 Body mass index (BMI) 33.0-33.9, adult: Secondary | ICD-10-CM | POA: Diagnosis not present

## 2016-09-08 DIAGNOSIS — O9081 Anemia of the puerperium: Secondary | ICD-10-CM | POA: Diagnosis not present

## 2016-09-08 LAB — CBC
HCT: 38.8 % (ref 35.0–47.0)
HEMOGLOBIN: 13 g/dL (ref 12.0–16.0)
MCH: 27.7 pg (ref 26.0–34.0)
MCHC: 33.6 g/dL (ref 32.0–36.0)
MCV: 82.4 fL (ref 80.0–100.0)
Platelets: 303 10*3/uL (ref 150–440)
RBC: 4.7 MIL/uL (ref 3.80–5.20)
RDW: 15 % — ABNORMAL HIGH (ref 11.5–14.5)
WBC: 12.5 10*3/uL — ABNORMAL HIGH (ref 3.6–11.0)

## 2016-09-08 LAB — TYPE AND SCREEN
ABO/RH(D): O POS
Antibody Screen: NEGATIVE

## 2016-09-08 MED ORDER — PRENATAL MULTIVITAMIN CH
1.0000 | ORAL_TABLET | Freq: Every day | ORAL | Status: DC
  Administered 2016-09-09: 1 via ORAL
  Filled 2016-09-08: qty 1

## 2016-09-08 MED ORDER — OXYTOCIN 40 UNITS IN LACTATED RINGERS INFUSION - SIMPLE MED
2.5000 [IU]/h | INTRAVENOUS | Status: DC
  Administered 2016-09-08: 2.5 [IU]/h via INTRAVENOUS
  Filled 2016-09-08: qty 1000

## 2016-09-08 MED ORDER — TERBUTALINE SULFATE 1 MG/ML IJ SOLN: 0.2500 mg | Freq: Once | INTRAMUSCULAR | Status: DC | PRN

## 2016-09-08 MED ORDER — OXYCODONE HCL 5 MG PO TABS: 5.0000 mg | ORAL_TABLET | ORAL | Status: DC | PRN

## 2016-09-08 MED ORDER — PENICILLIN G POT IN DEXTROSE 60000 UNIT/ML IV SOLN
3.0000 10*6.[IU] | INTRAVENOUS | Status: DC
  Administered 2016-09-08 (×2): 3 10*6.[IU] via INTRAVENOUS
  Filled 2016-09-08 (×7): qty 50

## 2016-09-08 MED ORDER — OXYTOCIN 10 UNIT/ML IJ SOLN
INTRAMUSCULAR | Status: AC
  Filled 2016-09-08: qty 2

## 2016-09-08 MED ORDER — SIMETHICONE 80 MG PO CHEW: 80.0000 mg | CHEWABLE_TABLET | ORAL | Status: DC | PRN

## 2016-09-08 MED ORDER — LACTATED RINGERS IV SOLN: 500.0000 mL | INTRAVENOUS | Status: DC | PRN

## 2016-09-08 MED ORDER — OXYTOCIN 40 UNITS IN LACTATED RINGERS INFUSION - SIMPLE MED
1.0000 m[IU]/min | INTRAVENOUS | Status: DC
  Administered 2016-09-08: 1 m[IU]/min via INTRAVENOUS

## 2016-09-08 MED ORDER — MISOPROSTOL 200 MCG PO TABS
ORAL_TABLET | ORAL | Status: AC
  Filled 2016-09-08: qty 4

## 2016-09-08 MED ORDER — WITCH HAZEL-GLYCERIN EX PADS: 1.0000 "application " | MEDICATED_PAD | CUTANEOUS | Status: DC | PRN

## 2016-09-08 MED ORDER — COCONUT OIL OIL: 1.0000 "application " | TOPICAL_OIL | Status: DC | PRN

## 2016-09-08 MED ORDER — LACTATED RINGERS IV SOLN
INTRAVENOUS | Status: DC
  Administered 2016-09-08: 125 mL/h via INTRAVENOUS
  Administered 2016-09-08: 1000 mL via INTRAVENOUS

## 2016-09-08 MED ORDER — OXYTOCIN BOLUS FROM INFUSION
500.0000 mL | Freq: Once | INTRAVENOUS | Status: AC
  Administered 2016-09-08: 500 mL via INTRAVENOUS

## 2016-09-08 MED ORDER — DIPHENHYDRAMINE HCL 25 MG PO CAPS: 25.0000 mg | ORAL_CAPSULE | Freq: Four times a day (QID) | ORAL | Status: DC | PRN

## 2016-09-08 MED ORDER — ACETAMINOPHEN 325 MG PO TABS: 650.0000 mg | ORAL_TABLET | ORAL | Status: DC | PRN

## 2016-09-08 MED ORDER — TETANUS-DIPHTH-ACELL PERTUSSIS 5-2.5-18.5 LF-MCG/0.5 IM SUSP: 0.5000 mL | Freq: Once | INTRAMUSCULAR | Status: DC

## 2016-09-08 MED ORDER — BUTORPHANOL TARTRATE 2 MG/ML IJ SOLN
2.0000 mg | INTRAMUSCULAR | Status: DC | PRN
  Administered 2016-09-08 (×2): 2 mg via INTRAVENOUS
  Filled 2016-09-08 (×2): qty 1

## 2016-09-08 MED ORDER — SENNOSIDES-DOCUSATE SODIUM 8.6-50 MG PO TABS
2.0000 | ORAL_TABLET | ORAL | Status: DC
  Administered 2016-09-09: 2 via ORAL
  Filled 2016-09-08: qty 2

## 2016-09-08 MED ORDER — BENZOCAINE-MENTHOL 20-0.5 % EX AERO: 1.0000 "application " | INHALATION_SPRAY | CUTANEOUS | Status: DC | PRN

## 2016-09-08 MED ORDER — ONDANSETRON HCL 4 MG/2ML IJ SOLN: 4.0000 mg | INTRAMUSCULAR | Status: DC | PRN

## 2016-09-08 MED ORDER — DIBUCAINE 1 % RE OINT: 1.0000 "application " | TOPICAL_OINTMENT | RECTAL | Status: DC | PRN

## 2016-09-08 MED ORDER — LIDOCAINE HCL (PF) 1 % IJ SOLN
INTRAMUSCULAR | Status: AC
  Filled 2016-09-08: qty 30

## 2016-09-08 MED ORDER — IBUPROFEN 600 MG PO TABS
600.0000 mg | ORAL_TABLET | Freq: Four times a day (QID) | ORAL | Status: DC
  Administered 2016-09-08 – 2016-09-09 (×5): 600 mg via ORAL
  Filled 2016-09-08 (×5): qty 1

## 2016-09-08 MED ORDER — ONDANSETRON HCL 4 MG PO TABS: 4.0000 mg | ORAL_TABLET | ORAL | Status: DC | PRN

## 2016-09-08 MED ORDER — ONDANSETRON HCL 4 MG/2ML IJ SOLN: 4.0000 mg | Freq: Four times a day (QID) | INTRAMUSCULAR | Status: DC | PRN

## 2016-09-08 MED ORDER — PENICILLIN G POTASSIUM 5000000 UNITS IJ SOLR
5.0000 10*6.[IU] | Freq: Once | INTRAVENOUS | Status: AC
  Administered 2016-09-08: 5 10*6.[IU] via INTRAVENOUS
  Filled 2016-09-08: qty 5

## 2016-09-08 MED ORDER — OXYCODONE HCL 5 MG PO TABS: 10.0000 mg | ORAL_TABLET | ORAL | Status: DC | PRN

## 2016-09-08 MED ORDER — AMMONIA AROMATIC IN INHA
RESPIRATORY_TRACT | Status: AC
  Filled 2016-09-08: qty 10

## 2016-09-08 NOTE — Progress Notes (Signed)
[id number] Will continue to monitor.

## 2016-09-08 NOTE — Progress Notes (Signed)
  Labor Progress Note   41 y.o. Z6X0960G4P3003 @ 6446w2d, admitted for leaking fluid and contractions.  Subjective:  Sitting up in bed, breathing through contractions.  Objective:  BP 127/75 (BP Location: Right Arm)   Pulse (!) 114   Temp 98 F (36.7 C) (Oral)   Resp 16   Ht 5\' 6"  (1.676 m)   Wt 207 lb (93.9 kg)   LMP 11/15/2015 (Approximate)   SpO2 99%   BMI 33.41 kg/m   SVE: (RN performed 0715) 8/100/-1  EFM: FHR:120 bpm, variability: moderate,  accelerations:  Present,  decelerations:  Absent Toco: Frequency: Every 2-4 minutes, Duration: 60-90 seconds and Intensity: moderate Labs: I have reviewed the patient's lab results.   Assessment & Plan:  A5W0981G4P3003 @ 3746w2d, admitted for leaking fluid and contractions.  1. Pain management: IV sedation. Has had Stadol x 2 Does not desire an epidural. 2. FWB: FHT category I.  3. ID: GBS positive/PCN x 2 given 4. Labor management: Active labor; continue to monitor.  Marcelyn BruinsJacelyn Schmid, CNM 09/08/2016  7:34 AM

## 2016-09-08 NOTE — Progress Notes (Signed)
  Labor Progress Note   [age] y.o. Z6X0960G4P3003 @ 4991w2d, admitted for leaking fluid and contractions.  Subjective:  Coping well with contractions.  Objective:  BP 128/84   Pulse (!) 122   Temp 97.7 F (36.5 C) (Oral)   Resp 20   Ht 5\' 6"  (1.676 m)   Wt 207 lb (93.9 kg)   LMP 11/15/2015 (Approximate)   SpO2 99%   BMI 33.41 kg/m   SVE: 6/90/-1  EFM: FHR:120 bpm, variability: moderate,  accelerations:  Present,  decelerations:  Absent Toco: Frequency: Every 2-4 minutes, Duration: 60-110 seconds and Intensity: moderate Labs: I have reviewed the patient's lab results.   Assessment & Plan:  A5W0981G4P3003 @ 1391w2d, admitted for leaking fluid and contractions.  1. Pain management: IV sedation. Received second dose. Patient does not desire an epidural at this time. 2. FWB: FHT category I.  3. ID: GBS positive/PCN  4. Labor management: Continue to monitor.  Kristen Phelps, CNM 09/08/2016  4:56 AM

## 2016-09-08 NOTE — H&P (Signed)
Obstetrics Admission History & Physical   CC: Contractions   HPI:  41 y.o. Z6X0960G4P3003 @ 5735w2d (09/06/2016, by Ultrasound). Admitted on 09/07/2016:   Patient Active Problem List   Diagnosis Date Noted  . Normal labor 09/08/2016  . BMI 31.0-31.9,adult 08/05/2016  . Supervision of high risk pregnancy, antepartum 03/18/2016  . Advanced maternal age in multigravida, unspecified trimester 03/18/2016  . Obesity affecting pregnancy, antepartum 03/18/2016     Presents for gush of fluid at home around 2330 and contractions.   Prenatal care at: at [facility]. Pregnancy complicated by Group B strep.  ROS: A review of systems was performed and negative, except as stated in the above HPI. PMHx: History reviewed. No pertinent past medical history. PSHx:  Past Surgical History:  Procedure Laterality Date  . TUBAL LIGATION  2003  . tubal reversal  10/2015   Medications:  Prescriptions Prior to Admission  Medication Sig Dispense Refill Last Dose  . Prenatal Vit-Fe Fumarate-FA (MULTIVITAMIN-PRENATAL) 27-0.8 MG TABS tablet Take 1 tablet by mouth daily at 12 noon.   09/07/2016 at Unknown time   Allergies: has No Known Allergies. OBHx:  OB History  Gravida Para Term Preterm AB Living  4 3 3     3   SAB TAB Ectopic Multiple Live Births          3    # Outcome Date GA Lbr Len/2nd Weight Sex Delivery Anes PTL Lv  4 Current           3 Term 10/06/01    F Vag-Spont  N LIV  2 Term 10/14/99    M Vag-Spont  N LIV  1 Term 11/13/94   6 lb 7 oz (2.92 kg) F Vag-Spont  N LIV     AVW:UJWJXBJY/NWGNFAOZHYQMFHx:Negative/unremarkable except as detailed in HPI.Marland Kitchen.  No family history of birth defects. Soc Hx: Former smoker and Alcohol: none  Objective:   Vitals:   09/08/16 0015 09/08/16 0138  BP: 129/78 121/75  Pulse: 89 96  Resp: 20   Temp: 97.6 F (36.4 C)   SpO2: 99%    Constitutional: Well nourished, well developed female in no acute distress.  HEENT: normal Skin: Warm and dry  Cardiovascular: Regular rate and rhythm    Extremity: trace to 1+ bilateral pedal edema Respiratory: Clear to auscultation bilaterally. Normal respiratory effort Abdomen: non-tender Neuro: DTRs 2+, Cranial nerves grossly intact Psych: Alert and Oriented x3. No memory deficits. Normal mood and affect.  MS: normal gait, normal bilateral lower extremity ROM/strength/stability.  Pelvic exam: is not limited by body habitus External Genitalia, Bartholin's glands, Urethra, Skene's glands: within normal limits Vagina: within normal limits and with normal discharge blood in the vault Cervix: 5/90/-2 Uterus: Spontaneous uterine activity and Adequate relaxation between contractions.  Adnexa: not evaluated  EFM: FHR: 120 bpm, variability: moderate, accelerations:  Present,  decelerations:  Absent Toco: Frequency: Every 2-3 minutes, Duration: 60-90 seconds and Intensity: moderate   Perinatal info:  Blood type: O positive Rubella- Immune Varicella -Immune TDaP Given during third trimester of this pregnancy RPR NR / HIV Neg/ HBsAg Neg   Assessment & Plan:   41 y.o. [id number] @ 5735w2d, Admitted on 09/07/2016 with leaking fluid at home and contractions.    Admit for labor, Antibiotics for GBS prophylaxis, Observe for cervical change and Fetal Wellbeing Reassuring  Marcelyn BruinsJacelyn Schmid, CNM [facility], [facility] 09/08/2016  1:49 AM

## 2016-09-08 NOTE — Consult Note (Signed)
Neonatology Note:  Attendance at Code Apgar:   Our team responded to a Code Apgar call to room # LDR5 following NSVD, due to infant with apnea. The requesting physician was Jane Gledhill, CNM.  The mother is a 41 y.o. G4P3003, GBS pos with aIAP. ROM occurred 6 hours PTD and the fluid was clear.  At delivery, the baby was with shoulder dystocia and loose nuchal cord and was without resp effort or tone. The OB nursing staff in attendance gave vigorous stimulation, started PPV and a Code Apgar was called. I arrived at ~1 minute of life, at which time the baby was beginning to cry and was with good tone and HR >100.  I continued drying and stimulation while giving blow by oxygen.  Nares suctioned.  Sao2 placed and saturations in mid 90s.  Lungs clearing.  Of note, right arm extended with little spontaneous movement unless stimulated.  Grasp present.  Clavicles intact; crepitus not appreciated.  Ap 2/9. I spoke with the father in the DR, then transferred the baby to the Pediatrician's care.   Please do not hesitate in contacting us if further concerns.   David C. Ehrmann, MD 

## 2016-09-08 NOTE — Progress Notes (Signed)
Spoke with Tillman SersJ. Schmid, CNM via phone regarding pt triage, SVE findings, uterine activity. CNM to evaluate in person.

## 2016-09-08 NOTE — OB Triage Note (Signed)
Pt arrived to L&D with c/o contractions every 2-3 mins and leaking of clear/pinkish fluid at home starting at 2330 with intermittent leaking since. Pt denies decreased fetal movement and vaginal bleeding. EFM and toco monitors applied and explained to pt. Will monitor closely.

## 2016-09-08 NOTE — Progress Notes (Signed)
  Labor Progress Note   41 y.o. Z6X0960G4P3003 @ 2750w2d, admitted for leaking fluid and contractions.  Subjective:  Resting between contractions. Breathing with contractions and coping well.  Objective:  BP 131/89   Pulse (!) 110   Temp 97.7 F (36.5 C) (Oral)   Resp 20   Ht 5\' 6"  (1.676 m)   Wt 207 lb (93.9 kg)   LMP 11/15/2015 (Approximate)   SpO2 99%   BMI 33.41 kg/m   SVE: deferred; last 5/90/-2  EFM: FHR:120 bpm, variability: moderate,  accelerations:  Present,  decelerations:  Absent Toco: Frequency: Every 2-3 minutes, Duration: 45-120 seconds and Intensity: moderate Labs: I have reviewed the patient's lab results.   Assessment & Plan:  A5W0981G4P3003 @ 4750w2d, admitted for leaking fluid and contractions.  1. Pain management: IV sedation. Patient does not desire an epidural at this time. 2. FWB: FHT category I.  3. ID: GBS positive/PCN - first dose given 4. Labor management: Continue to monitor.  Marcelyn BruinsJacelyn Germany Dodgen, CNM 09/08/2016  3:44 AM

## 2016-09-08 NOTE — Discharge Summary (Signed)
OB Discharge Summary     Patient Name: Kristen Phelps DOB: [birth year] MRN: [id number]  Date of admission: 09/07/2016 Delivering provider: Tresea MallJane Gledhill, CNM  Date of Delivery: 09/08/2016  Date of discharge: 09/09/2016   Admitting diagnosis: 40 wks preg water broke Intrauterine pregnancy: 3665w2d     Secondary diagnosis: AMA, obesity     Discharge diagnosis: Term Pregnancy Delivered                                                                                                Post partum procedures:none  Augmentation: Pitocin  Complications: None  Hospital course:  Onset of Labor With Vaginal Delivery      41 y.o. yo G4P4003 at 5365w2d was admitted in Active Labor on 09/07/2016.  Patient had an uncomplicated labor course as follows:  Membrane Rupture Time/Date: 11:30 PM ,09/07/2016   Patient had delivery of viable female 11:37 AM on 09/08/2016 Details of delivery can be found in a separate delivery note.  Kristen Phelps had an uncomplicated postpartum course.   She is ambulating, tolerating a regular diet, passing flatus, and urinating well.  Patient is discharged home in stable condition on 09/09/16.   Physical exam  Vitals:   09/09/16 0422 09/09/16 0815 09/09/16 1131 09/09/16 1553  BP: 110/65 105/60 111/66   Pulse: 90 76 92   Resp: 20 20 20    Temp: 98 F (36.7 C) 98.1 F (36.7 C) 98.1 F (36.7 C) 98.2 F (36.8 C)  TempSrc: Oral Oral Oral Oral  SpO2:      Weight:      Height:       General: alert, cooperative and no distress Lochia: appropriate Uterine Fundus: firm Incision: N/A DVT Evaluation: No evidence of DVT seen on physical exam.  Labs: Lab Results  Component Value Date   WBC 17.5 (H) 09/09/2016   HGB 11.1 (L) 09/09/2016   HCT 33.6 (L) 09/09/2016   MCV 83.2 09/09/2016   PLT 267 09/09/2016   Prenatal labs: O positive, Rubella Immune, Varicella Immune, GBS positive with adequate treatment  Discharge instruction: per After Visit Summary.  Medications:  Allergies as of  09/09/2016   No Known Allergies     Medication List    TAKE these medications   ibuprofen 600 MG tablet Commonly known as:  ADVIL,MOTRIN Take 1 tablet (600 mg total) by mouth every 6 (six) hours.   multivitamin-prenatal 27-0.8 MG Tabs tablet Take 1 tablet by mouth daily at 12 noon.            Discharge Care Instructions        Start     Ordered   09/09/16 0000  ibuprofen (ADVIL,MOTRIN) 600 MG tablet  Every 6 hours     09/09/16 0925   09/09/16 0000  Call MD for:  temperature >100.4     09/09/16 0925   09/09/16 0000  Call MD for:  persistant nausea and vomiting     09/09/16 0925   09/09/16 0000  Call MD for:  severe uncontrolled pain     09/09/16 0925   09/09/16 0000  Call MD for:  difficulty breathing, headache or visual disturbances     09/09/16 0925   09/09/16 0000  Call MD for:  persistant dizziness or light-headedness     09/09/16 0925   09/09/16 0000  Activity as tolerated     09/09/16 0925   09/09/16 0000  Lifting restrictions    Comments:  Weight restriction of 20 lbs.   09/09/16 0925   09/09/16 0000  Driving restriction    Comments:  Avoid driving for at least 2 weeks. No driving while taking narcotic pain medication.   09/09/16 0925   09/09/16 0000  Sexual acrtivity    Comments:  Pelvic rest for 6 weeks   09/09/16 0925   09/09/16 0000  Diet general     09/09/16 0925   09/09/16 0000  Discharge wound care:    Comments:  Perform wound care instructions   09/09/16 0925      Diet: routine diet  Activity: Advance as tolerated. Pelvic rest for 6 weeks.   Outpatient follow up: Follow-up Information    Tresea Mall, CNM. Schedule an appointment as soon as possible for a visit in 6 week(s).   Specialty:  Obstetrics Why:  postpartum follow up visit Contact information: [address] [place] [address] [phone]             Postpartum contraception: OCPs Rhogam Given postpartum: NA Rubella vaccine given postpartum: Rubella  Immune Varicella vaccine given postpartum: Varicella Immune TDaP given antepartum or postpartum: given 07/01/2016  Newborn Data: Live born female  Birth Weight: 3560 g   APGAR: 2, 9   Baby Feeding: Breast  Disposition:home with mother  SIGNED: Thomasene Mohair, MD 09/09/2016 8:33 PM

## 2016-09-09 LAB — CBC
HCT: 33.6 % — ABNORMAL LOW (ref 35.0–47.0)
Hemoglobin: 11.1 g/dL — ABNORMAL LOW (ref 12.0–16.0)
MCH: 27.6 pg (ref 26.0–34.0)
MCHC: 33.2 g/dL (ref 32.0–36.0)
MCV: 83.2 fL (ref 80.0–100.0)
PLATELETS: 267 10*3/uL (ref 150–440)
RBC: 4.03 MIL/uL (ref 3.80–5.20)
RDW: 15.1 % — ABNORMAL HIGH (ref 11.5–14.5)
WBC: 17.5 10*3/uL — ABNORMAL HIGH (ref 3.6–11.0)

## 2016-09-09 LAB — RPR: RPR: NONREACTIVE

## 2016-09-09 MED ORDER — IBUPROFEN 600 MG PO TABS: 600.0000 mg | ORAL_TABLET | Freq: Four times a day (QID) | ORAL | 0 refills | Status: AC

## 2016-09-09 NOTE — Discharge Summary (Signed)
Discharge instructions reviewed and signed with pt. Verbalizes understanding of all dc instructions. Prescriptions received. Follow up appt discussed. dc'd to home via wc with infant in arms at 2045 

## 2016-09-09 NOTE — Progress Notes (Signed)
Patient ID: Kristen BogusRhonda K Phelps, female   DOB: 1975/07/22, 41 y.o.   MRN: [id number]  Obstetric Postpartum Daily Progress Note Subjective:  41 y.o. G4P4003 postpartum day #1 status post vaginal delivery.  She is ambulating, is tolerating po, is voiding spontaneously.  Her pain is well controlled on PO pain medications. Her lochia is less than menses.   Medications SCHEDULED MEDICATIONS  . ibuprofen  600 mg Oral Q6H  . prenatal multivitamin  1 tablet Oral Q1200  . senna-docusate  2 tablet Oral Q24H  . Tdap  0.5 mL Intramuscular Once    MEDICATION INFUSIONS    PRN MEDICATIONS  acetaminophen, benzocaine-Menthol, coconut oil, witch hazel-glycerin **AND** dibucaine, diphenhydrAMINE, ondansetron **OR** ondansetron (ZOFRAN) IV, oxyCODONE, oxyCODONE, simethicone    Objective:   Vitals:   09/08/16 1906 09/09/16 0028 09/09/16 0422 09/09/16 0815  BP:  (!) 110/53 110/65 105/60  Pulse:  99 90 76  Resp:  20 20 20   Temp:  98 F (36.7 C) 98 F (36.7 C) 98.1 F (36.7 C)  TempSrc:  Oral Oral Oral  SpO2: 99%     Weight:      Height:        Current Vital Signs 24h Vital Sign Ranges  T 98.1 F (36.7 C) Temp  Avg: 98.1 F (36.7 C)  Min: 97.8 F (36.6 C)  Max: 98.7 F (37.1 C)  BP 105/60 BP  Min: 90/69  Max: 140/69  HR 76 Pulse  Avg: 101.8  Min: 76  Max: 165  RR 20 Resp  Avg: 18.3  Min: 16  Max: 20  SaO2 99 % Not Delivered SpO2  Avg: 99.3 %  Min: 99 %  Max: 100 %       24 Hour I/O Current Shift I/O  Time Ins Outs 09/06 0701 - 09/07 0700 In: 1314 [P.O.:360; I.V.:954] Out: -  No intake/output data recorded.  General: NAD Pulmonary: no increased work of breathing Abdomen: non-distended, non-tender, fundus firm at level of umbilicus Extremities: no edema, no erythema, no tenderness  Labs:   Recent Labs Lab 09/08/16 0106 09/09/16 0607  WBC 12.5* 17.5*  HGB 13.0 11.1*  HCT 38.8 33.6*  PLT 303 267     Assessment:   41 y.o. G4P4003 postpartum day # 1 status post SVD, doing  well  Plan:   1) Acute blood loss anemia - hemodynamically stable and asymptomatic - po ferrous sulfate  2) O POS / Rubella 6.72 (03/16 1536)/ Varicella Immune  3) TDAP status up to date  4) breast feeding /Contraception = undecided  5) Disposition: home today or tomorrow  Thomasene MohairStephen Jackson, MD 09/09/2016 9:26 AM

## 2016-10-21 ENCOUNTER — Encounter: Payer: Self-pay | Admitting: Advanced Practice Midwife

## 2016-10-21 ENCOUNTER — Ambulatory Visit (INDEPENDENT_AMBULATORY_CARE_PROVIDER_SITE_OTHER): Payer: Managed Care, Other (non HMO) | Admitting: Advanced Practice Midwife

## 2016-10-21 DIAGNOSIS — Z30013 Encounter for initial prescription of injectable contraceptive: Secondary | ICD-10-CM

## 2016-10-21 MED ORDER — MEDROXYPROGESTERONE ACETATE 150 MG/ML IM SUSP: 150.0000 mg | INTRAMUSCULAR | 3 refills | Status: DC

## 2016-10-21 NOTE — Patient Instructions (Signed)
Preventive Care 40-64 Years, Female Preventive care refers to lifestyle choices and visits with your health care provider that can promote health and wellness. What does preventive care include?  A yearly physical exam. This is also called an annual well check.  Dental exams once or twice a year.  Routine eye exams. Ask your health care provider how often you should have your eyes checked.  Personal lifestyle choices, including: ? Daily care of your teeth and gums. ? Regular physical activity. ? Eating a healthy diet. ? Avoiding tobacco and drug use. ? Limiting alcohol use. ? Practicing safe sex. ? Taking low-dose aspirin daily starting at age 58. ? Taking vitamin and mineral supplements as recommended by your health care provider. What happens during an annual well check? The services and screenings done by your health care provider during your annual well check will depend on your age, overall health, lifestyle risk factors, and family history of disease. Counseling Your health care provider may ask you questions about your:  Alcohol use.  Tobacco use.  Drug use.  Emotional well-being.  Home and relationship well-being.  Sexual activity.  Eating habits.  Work and work Statistician.  Method of birth control.  Menstrual cycle.  Pregnancy history.  Screening You may have the following tests or measurements:  Height, weight, and BMI.  Blood pressure.  Lipid and cholesterol levels. These may be checked every 5 years, or more frequently if you are over 81 years old.  Skin check.  Lung cancer screening. You may have this screening every year starting at age 78 if you have a 30-pack-year history of smoking and currently smoke or have quit within the past 15 years.  Fecal occult blood test (FOBT) of the stool. You may have this test every year starting at age 65.  Flexible sigmoidoscopy or colonoscopy. You may have a sigmoidoscopy every 5 years or a colonoscopy  every 10 years starting at age 30.  Hepatitis C blood test.  Hepatitis B blood test.  Sexually transmitted disease (STD) testing.  Diabetes screening. This is done by checking your blood sugar (glucose) after you have not eaten for a while (fasting). You may have this done every 1-3 years.  Mammogram. This may be done every 1-2 years. Talk to your health care provider about when you should start having regular mammograms. This may depend on whether you have a family history of breast cancer.  BRCA-related cancer screening. This may be done if you have a family history of breast, ovarian, tubal, or peritoneal cancers.  Pelvic exam and Pap test. This may be done every 3 years starting at age 80. Starting at age 36, this may be done every 5 years if you have a Pap test in combination with an HPV test.  Bone density scan. This is done to screen for osteoporosis. You may have this scan if you are at high risk for osteoporosis.  Discuss your test results, treatment options, and if necessary, the need for more tests with your health care provider. Vaccines Your health care provider may recommend certain vaccines, such as:  Influenza vaccine. This is recommended every year.  Tetanus, diphtheria, and acellular pertussis (Tdap, Td) vaccine. You may need a Td booster every 10 years.  Varicella vaccine. You may need this if you have not been vaccinated.  Zoster vaccine. You may need this after age 5.  Measles, mumps, and rubella (MMR) vaccine. You may need at least one dose of MMR if you were born in  1957 or later. You may also need a second dose.  Pneumococcal 13-valent conjugate (PCV13) vaccine. You may need this if you have certain conditions and were not previously vaccinated.  Pneumococcal polysaccharide (PPSV23) vaccine. You may need one or two doses if you smoke cigarettes or if you have certain conditions.  Meningococcal vaccine. You may need this if you have certain  conditions.  Hepatitis A vaccine. You may need this if you have certain conditions or if you travel or work in places where you may be exposed to hepatitis A.  Hepatitis B vaccine. You may need this if you have certain conditions or if you travel or work in places where you may be exposed to hepatitis B.  Haemophilus influenzae type b (Hib) vaccine. You may need this if you have certain conditions.  Talk to your health care provider about which screenings and vaccines you need and how often you need them. This information is not intended to replace advice given to you by your health care provider. Make sure you discuss any questions you have with your health care provider. Document Released: 01/16/2015 Document Revised: 09/09/2015 Document Reviewed: 10/21/2014 Elsevier Interactive Patient Education  2017 [employer].

## 2016-10-21 NOTE — Progress Notes (Signed)
Postpartum Visit  Chief Complaint:  Chief Complaint  Patient presents with  . Postpartum Care    no problems    History of Present Illness: Patient is a 41 y.o. [id number] presents for postpartum visit.  Review the Delivery Report for details.  Date of delivery: 09/08/2016 Type of delivery: Vaginal delivery - Vacuum or forceps assisted  no Episiotomy No.  Laceration: no  Pregnancy or labor problems:  no Any problems since the delivery:  no  Newborn Details:  SINGLETON :  1. BabyGender: female. Birth weight: 3560 g Maternal Details:  Breast Feeding:  yes Post partum depression/anxiety noted:  no Edinburgh Post-Partum Depression Score:  0  Date of last PAP: March 2018  normal   Review of Systems: Review of Systems  Constitutional: Negative.   HENT: Negative.   Eyes: Negative.   Respiratory: Negative.   Cardiovascular: Negative.   Gastrointestinal: Negative.   Genitourinary: Negative.   Musculoskeletal: Negative.   Skin: Negative.   Neurological: Negative.   Endo/Heme/Allergies: Negative.   Psychiatric/Behavioral: Negative.      Past Medical History:  History reviewed. No pertinent past medical history.  Past Surgical History:  Past Surgical History:  Procedure Laterality Date  . TUBAL LIGATION  2003  . tubal reversal  10/2015    Family History:  History reviewed. No pertinent family history.  Social History:  Social History   Social History  . Marital status: Single    Spouse name: N/A  . Number of children: N/A  . Years of education: N/A   Occupational History  . Not on file.   Social History Main Topics  . Smoking status: Former Games developermoker  . Smokeless tobacco: Never Used  . Alcohol use Yes     Comment: Social  . Drug use: No  . Sexual activity: Yes    Birth control/ protection: None   Other Topics Concern  . Not on file   Social History Narrative  . No narrative on file    Allergies:  No Known Allergies  Medications: Prior to  Admission medications   Medication Sig Start Date End Date Taking? Authorizing Provider  ibuprofen (ADVIL,MOTRIN) 600 MG tablet Take 1 tablet (600 mg total) by mouth every 6 (six) hours. 09/09/16   Conard NovakJackson, Stephen D, MD  medroxyPROGESTERone (DEPO-PROVERA) 150 MG/ML injection Inject 1 mL (150 mg total) into the muscle every 3 (three) months. 10/21/16   Tresea MallGledhill, Torrance Stockley, CNM  Prenatal Vit-Fe Fumarate-FA (MULTIVITAMIN-PRENATAL) 27-0.8 MG TABS tablet Take 1 tablet by mouth daily at 12 noon.    [provider]    Physical Exam Vitals:  Vitals:   10/21/16 1335  BP: 140/80  Pulse: 84    General: NAD HEENT: normocephalic, anicteric Pulmonary: No increased work of breathing Abdomen: NABS, soft, non-tender, non-distended.  Umbilicus without lesions.  No hepatomegaly, splenomegaly or masses palpable. No evidence of hernia. Genitourinary:  External: Normal external female genitalia.  Normal urethral meatus, normal  Bartholin's and Skene's glands.    Vagina: Normal vaginal mucosa, no evidence of prolapse.    Cervix: Grossly normal in appearance, no bleeding, no CMT  Uterus: Non-enlarged, mobile, normal contour.    Adnexa: ovaries non-enlarged, no adnexal masses  Rectal: deferred Extremities: no edema, erythema, or tenderness Neurologic: Grossly intact Psychiatric: mood appropriate, affect full  Assessment: 41 y.o. [id number] presenting for 6 week postpartum visit  Plan: Problem List Items Addressed This Visit    None    Visit Diagnoses    6 weeks postpartum  follow-up    -  Primary   Encounter for initial prescription of injectable contraceptive       Relevant Medications   medroxyPROGESTERone (DEPO-PROVERA) 150 MG/ML injection       1) Contraception Education given regarding options for contraception, including breast feeding safe options: IUD, Nexplanon, pill, injection. Patient chooses injection  2)  Pap - ASCCP guidelines and rational discussed.  Patient opts for 3 year  screening interval  3) Patient underwent screening for postpartum depression with no concerns noted.  4) Follow up 1 year for routine annual exam  Tresea Mall, CNM

## 2016-10-26 ENCOUNTER — Ambulatory Visit (INDEPENDENT_AMBULATORY_CARE_PROVIDER_SITE_OTHER): Payer: Managed Care, Other (non HMO)

## 2016-10-26 DIAGNOSIS — Z789 Other specified health status: Secondary | ICD-10-CM | POA: Diagnosis not present

## 2016-10-26 DIAGNOSIS — N912 Amenorrhea, unspecified: Secondary | ICD-10-CM

## 2016-10-26 LAB — POCT URINE PREGNANCY: Preg Test, Ur: NEGATIVE

## 2016-10-26 MED ORDER — MEDROXYPROGESTERONE ACETATE 150 MG/ML IM SUSP
150.0000 mg | Freq: Once | INTRAMUSCULAR | Status: AC
  Administered 2016-10-26: 150 mg via INTRAMUSCULAR

## 2017-01-18 ENCOUNTER — Ambulatory Visit (INDEPENDENT_AMBULATORY_CARE_PROVIDER_SITE_OTHER): Payer: Managed Care, Other (non HMO)

## 2017-01-18 DIAGNOSIS — Z3042 Encounter for surveillance of injectable contraceptive: Secondary | ICD-10-CM

## 2017-01-18 MED ORDER — MEDROXYPROGESTERONE ACETATE 150 MG/ML IM SUSP
150.0000 mg | Freq: Once | INTRAMUSCULAR | Status: AC
  Administered 2017-01-18: 150 mg via INTRAMUSCULAR

## 2017-01-18 NOTE — Progress Notes (Signed)
Medroxy

## 2017-04-12 ENCOUNTER — Ambulatory Visit (INDEPENDENT_AMBULATORY_CARE_PROVIDER_SITE_OTHER): Payer: Managed Care, Other (non HMO)

## 2017-04-12 DIAGNOSIS — Z308 Encounter for other contraceptive management: Secondary | ICD-10-CM | POA: Diagnosis not present

## 2017-04-12 MED ORDER — MEDROXYPROGESTERONE ACETATE 150 MG/ML IM SUSP
150.0000 mg | Freq: Once | INTRAMUSCULAR | Status: AC
  Administered 2017-04-12: 150 mg via INTRAMUSCULAR

## 2017-04-12 NOTE — Progress Notes (Signed)
Pt here for depo which was given IM right glut.  NDC# [address]-2 

## 2017-07-05 ENCOUNTER — Other Ambulatory Visit: Payer: Self-pay

## 2017-07-05 ENCOUNTER — Ambulatory Visit (INDEPENDENT_AMBULATORY_CARE_PROVIDER_SITE_OTHER): Payer: Managed Care, Other (non HMO)

## 2017-07-05 DIAGNOSIS — Z3049 Encounter for surveillance of other contraceptives: Secondary | ICD-10-CM | POA: Diagnosis not present

## 2017-07-05 DIAGNOSIS — Z30013 Encounter for initial prescription of injectable contraceptive: Secondary | ICD-10-CM

## 2017-07-05 DIAGNOSIS — Z308 Encounter for other contraceptive management: Secondary | ICD-10-CM

## 2017-07-05 MED ORDER — MEDROXYPROGESTERONE ACETATE 150 MG/ML IM SUSP
150.0000 mg | Freq: Once | INTRAMUSCULAR | Status: AC
Start: 2017-07-05 — End: 2017-07-05
  Administered 2017-07-05: 150 mg via INTRAMUSCULAR

## 2017-07-05 MED ORDER — MEDROXYPROGESTERONE ACETATE 150 MG/ML IM SUSP: 150.0000 mg | INTRAMUSCULAR | 0 refills | Status: DC

## 2017-07-05 NOTE — Progress Notes (Signed)
Pt here for depo which was given IM right glut.  NDC# [address]-2 

## 2017-09-18 ENCOUNTER — Other Ambulatory Visit: Payer: Self-pay | Admitting: Advanced Practice Midwife

## 2017-09-18 DIAGNOSIS — Z30013 Encounter for initial prescription of injectable contraceptive: Secondary | ICD-10-CM

## 2017-09-27 ENCOUNTER — Ambulatory Visit (INDEPENDENT_AMBULATORY_CARE_PROVIDER_SITE_OTHER): Payer: Managed Care, Other (non HMO)

## 2017-09-27 DIAGNOSIS — Z3042 Encounter for surveillance of injectable contraceptive: Secondary | ICD-10-CM

## 2017-09-27 MED ORDER — MEDROXYPROGESTERONE ACETATE 150 MG/ML IM SUSP
150.0000 mg | Freq: Once | INTRAMUSCULAR | Status: AC
  Administered 2017-09-27: 150 mg via INTRAMUSCULAR

## 2017-12-20 ENCOUNTER — Ambulatory Visit (INDEPENDENT_AMBULATORY_CARE_PROVIDER_SITE_OTHER): Payer: Managed Care, Other (non HMO)

## 2017-12-20 DIAGNOSIS — Z3042 Encounter for surveillance of injectable contraceptive: Secondary | ICD-10-CM

## 2017-12-20 MED ORDER — MEDROXYPROGESTERONE ACETATE 150 MG/ML IM SUSP
150.0000 mg | Freq: Once | INTRAMUSCULAR | Status: AC
  Administered 2017-12-20: 150 mg via INTRAMUSCULAR

## 2018-03-14 ENCOUNTER — Ambulatory Visit: Payer: Managed Care, Other (non HMO)

## 2018-03-21 ENCOUNTER — Other Ambulatory Visit: Payer: Self-pay

## 2018-03-21 ENCOUNTER — Ambulatory Visit (INDEPENDENT_AMBULATORY_CARE_PROVIDER_SITE_OTHER): Payer: Managed Care, Other (non HMO)

## 2018-03-21 DIAGNOSIS — Z3042 Encounter for surveillance of injectable contraceptive: Secondary | ICD-10-CM | POA: Diagnosis not present

## 2018-03-21 MED ORDER — MEDROXYPROGESTERONE ACETATE 150 MG/ML IM SUSP
150.0000 mg | Freq: Once | INTRAMUSCULAR | Status: AC
Start: 2018-03-21 — End: 2018-03-21
  Administered 2018-03-21: 150 mg via INTRAMUSCULAR

## 2018-06-13 ENCOUNTER — Other Ambulatory Visit: Payer: Self-pay

## 2018-06-13 ENCOUNTER — Ambulatory Visit (INDEPENDENT_AMBULATORY_CARE_PROVIDER_SITE_OTHER): Payer: Managed Care, Other (non HMO)

## 2018-06-13 DIAGNOSIS — Z3042 Encounter for surveillance of injectable contraceptive: Secondary | ICD-10-CM | POA: Diagnosis not present

## 2018-06-13 MED ORDER — MEDROXYPROGESTERONE ACETATE 150 MG/ML IM SUSP
150.0000 mg | Freq: Once | INTRAMUSCULAR | Status: AC
  Administered 2018-06-13: 150 mg via INTRAMUSCULAR

## 2018-07-05 ENCOUNTER — Ambulatory Visit (INDEPENDENT_AMBULATORY_CARE_PROVIDER_SITE_OTHER): Payer: Managed Care, Other (non HMO) | Admitting: Obstetrics and Gynecology

## 2018-07-05 ENCOUNTER — Other Ambulatory Visit: Payer: Self-pay

## 2018-07-05 ENCOUNTER — Other Ambulatory Visit (HOSPITAL_COMMUNITY)
Admission: RE | Admit: 2018-07-05 | Discharge: 2018-07-05 | Disposition: A | Discharge status: Home / Self Care | Payer: BC Managed Care – PPO | Source: Ambulatory Visit | Attending: Obstetrics and Gynecology | Admitting: Obstetrics and Gynecology

## 2018-07-05 ENCOUNTER — Encounter: Payer: Self-pay | Admitting: Obstetrics and Gynecology

## 2018-07-05 VITALS — BP 124/76 | Ht 66.0 in | Wt 228.0 lb

## 2018-07-05 DIAGNOSIS — Z01419 Encounter for gynecological examination (general) (routine) without abnormal findings: Secondary | ICD-10-CM

## 2018-07-05 DIAGNOSIS — Z1339 Encounter for screening examination for other mental health and behavioral disorders: Secondary | ICD-10-CM

## 2018-07-05 DIAGNOSIS — Z30016 Encounter for initial prescription of transdermal patch hormonal contraceptive device: Secondary | ICD-10-CM

## 2018-07-05 DIAGNOSIS — Z113 Encounter for screening for infections with a predominantly sexual mode of transmission: Secondary | ICD-10-CM

## 2018-07-05 DIAGNOSIS — Z124 Encounter for screening for malignant neoplasm of cervix: Secondary | ICD-10-CM | POA: Insufficient documentation

## 2018-07-05 DIAGNOSIS — Z1331 Encounter for screening for depression: Secondary | ICD-10-CM

## 2018-07-05 LAB — HM PAP SMEAR

## 2018-07-05 MED ORDER — XULANE 150-35 MCG/24HR TD PTWK: 1.0000 | MEDICATED_PATCH | TRANSDERMAL | 12 refills | Status: DC

## 2018-07-05 NOTE — Progress Notes (Signed)
Gynecology Annual Exam  PCP: Patient, No Pcp Per  Chief Complaint:  Chief Complaint  Patient presents with  . Annual Exam    History of Present Illness:  Ms. Hale BogusRhonda K Tennis is a 43 y.o. [phone] who LMP was No LMP recorded. Patient has had an injection., presents today for her annual examination.  She is not having menses with the Depo Provera injection  She does not have vasomotor sx.   She is sexually active. She uses Depo Provera. No issues with intercourse.   Last Pap: 2 years  Results were: no abnormalities /neg HPV DNA not done. Hx of STDs: none  Last mammogram: "years ago."  Results were: normal per patient.  There is no FH of breast cancer. There is no FH of ovarian cancer. The patient does not do self-breast exams.  Colonoscopy: never had DEXA: has not been screened for osteoporosis  Tobacco use: former smoker.  She quit 3 year ago. Alcohol use: social drinker Exercise: moderately active (walks a lot, squatting, jogging in place)  The patient wears seatbelts: yes.     Past Medical History:  Diagnosis Date  . No known health problems     Past Surgical History:  Procedure Laterality Date  . TUBAL LIGATION  2003  . tubal reversal  10/2015    Prior to Admission medications   Medication Sig Start Date End Date Taking? Authorizing Provider  medroxyPROGESTERone (DEPO-PROVERA) 150 MG/ML injection Inject 1 mL (150 mg total) into the muscle every 3 (three) months. 07/05/17  Yes Schuman, Christanna R, MD  ibuprofen (ADVIL,MOTRIN) 600 MG tablet Take 1 tablet (600 mg total) by mouth every 6 (six) hours. Patient not taking: Reported on 07/05/2018 09/09/16   Conard NovakJackson, Ason Heslin D, MD  medroxyPROGESTERone Acetate 150 MG/ML SUSY INJECT 1 ML (150 MG TOTAL) INTO THE MUSCLE EVERY 3 (THREE) MONTHS. Patient not taking: Reported on 07/05/2018 09/18/17   Tresea MallGledhill, Jane, CNM  Prenatal Vit-Fe Fumarate-FA (MULTIVITAMIN-PRENATAL) 27-0.8 MG TABS tablet Take 1 tablet by mouth daily at 12 noon.     [provider]   Allergies: No Known Allergies   Obstetric History: A5W0981: G4P4003  Family History: denies history of gynecologic cancer   Social History   Socioeconomic History  . Marital status: Single    Spouse name: Not on file  . Number of children: Not on file  . Years of education: Not on file  . Highest education level: Not on file  Occupational History  . Not on file  Social Needs  . Financial resource strain: Not on file  . Food insecurity    Worry: Not on file    Inability: Not on file  . Transportation needs    Medical: Not on file    Non-medical: Not on file  Tobacco Use  . Smoking status: Former Games developermoker  . Smokeless tobacco: Never Used  Substance and Sexual Activity  . Alcohol use: Yes    Comment: Social  . Drug use: No  . Sexual activity: Yes    Birth control/protection: Injection  Lifestyle  . Physical activity    Days per week: Not on file    Minutes per session: Not on file  . Stress: Not on file  Relationships  . Social Musicianconnections    Talks on phone: Not on file    Gets together: Not on file    Attends religious service: Not on file    Active member of club or organization: Not on file    Attends meetings of clubs  or organizations: Not on file    Relationship status: Not on file  . Intimate partner violence    Fear of current or ex partner: Not on file    Emotionally abused: Not on file    Physically abused: Not on file    Forced sexual activity: Not on file  Other Topics Concern  . Not on file  Social History Narrative  . Not on file    Review of Systems  Constitutional: Negative.   HENT: Negative.   Eyes: Negative.   Respiratory: Negative.   Cardiovascular: Negative.   Gastrointestinal: Negative.   Genitourinary: Negative.   Musculoskeletal: Negative.   Skin: Negative.   Neurological: Negative.   Psychiatric/Behavioral: Negative.      Physical Exam BP 124/76   Ht 5\' 6"  (1.676 m)   Wt 228 lb (103.4 kg)   BMI 36.80  kg/m   Physical Exam Constitutional:      General: She is not in acute distress.    Appearance: Normal appearance. She is well-developed.  Genitourinary:     Pelvic exam was performed with patient supine.     Vulva, urethra, bladder and uterus normal.     No inguinal adenopathy present in the right or left side.    No signs of injury in the vagina.     No vaginal discharge, erythema, tenderness or bleeding.     No cervical motion tenderness, discharge, lesion or polyp.     Uterus is mobile.     Uterus is not enlarged or tender.     No uterine mass detected.    Uterus is anteverted.     No right or left adnexal mass present.     Right adnexa not tender or full.     Left adnexa not tender or full.  HENT:     Head: Normocephalic and atraumatic.  Eyes:     General: No scleral icterus.    Conjunctiva/sclera: Conjunctivae normal.  Neck:     Musculoskeletal: Normal range of motion and neck supple.     Thyroid: No thyromegaly.  Cardiovascular:     Rate and Rhythm: Normal rate and regular rhythm.     Heart sounds: No murmur. No friction rub. No gallop.   Pulmonary:     Effort: Pulmonary effort is normal. No respiratory distress.     Breath sounds: Normal breath sounds. No wheezing or rales.  Chest:     Breasts:        Right: No inverted nipple, mass, nipple discharge, skin change or tenderness.        Left: No inverted nipple, mass, nipple discharge, skin change or tenderness.  Abdominal:     General: Bowel sounds are normal. There is no distension.     Palpations: Abdomen is soft. There is no mass.     Tenderness: There is no abdominal tenderness. There is no guarding or rebound.  Musculoskeletal: Normal range of motion.        General: No swelling or tenderness.  Lymphadenopathy:     Cervical: No cervical adenopathy.     Lower Body: No right inguinal adenopathy. No left inguinal adenopathy.  Neurological:     General: No focal deficit present.     Mental Status: She is alert  and oriented to person, place, and time.     Cranial Nerves: No cranial nerve deficit.  Skin:    General: Skin is warm and dry.     Findings: No erythema or rash.  Psychiatric:  Mood and Affect: Mood normal.        Behavior: Behavior normal.        Judgment: Judgment normal.     Female chaperone present for pelvic and breast  portions of the physical exam  Results: AUDIT Questionnaire (screen for alcoholism): 2 PHQ-9: 0  Assessment: 43 y.o. [id number] female here for routine gynecologic examination.  Plan: Problem List Items Addressed This Visit    None    Visit Diagnoses    Women's annual routine gynecological examination    -  Primary   Relevant Medications   norelgestromin-ethinyl estradiol [name]) 150-35 MCG/24HR transdermal patch   Other Relevant Orders   Cytology - PAP   Screening for depression       Pap smear for cervical cancer screening       Relevant Orders   Cytology - PAP   Screening for alcoholism       Encounter for initial prescription of transdermal patch hormonal contraceptive device       Relevant Medications   norelgestromin-ethinyl estradiol [name]) 150-35 MCG/24HR transdermal patch   Screen for STD (sexually transmitted disease)       Relevant Orders   Cytology - PAP      Screening: -- Blood pressure screen normal -- Colonoscopy - not due -- Mammogram - due. Patient to call [facility] to arrange. She understands that it is her responsibility to arrange this. -- Weight screening: obese: discussed management options, including lifestyle, dietary, and exercise. -- Depression screening negative (PHQ-9) -- Nutrition: normal -- cholesterol screening: per PCP -- osteoporosis screening: not due -- tobacco screening: not using -- alcohol screening: AUDIT questionnaire indicates low-risk usage. -- family history of breast cancer screening: done. not at high risk. -- no evidence of domestic violence or intimate partner violence. -- STD screening:  gonorrhea/chlamydia NAAT collected -- pap smear collected per ASCCP guidelines -- HPV vaccination series: not eligilbe  Contraceptive management: We will prescribe Xulane as she has no contraindications.  Discussed increased risk in VTE.  All questions answered.  Prentice Docker, MD 07/05/2018 2:00 PM

## 2018-07-11 LAB — CYTOLOGY - PAP
Chlamydia: NEGATIVE
Diagnosis: NEGATIVE
HPV 16/18/45 genotyping: POSITIVE — AB
HPV: DETECTED — AB
Neisseria Gonorrhea: NEGATIVE

## 2018-07-12 ENCOUNTER — Telehealth: Payer: Self-pay | Admitting: Obstetrics and Gynecology

## 2018-07-12 NOTE — Telephone Encounter (Signed)
Discussed findings of abnormal Pap smear with patient, particularly the positive HPV 18.  Recommended a colposcopy to get more information about severity of abnormal Pap smear.  Reiterated the importance that without this kind of follow-up cervical cancer could develop.  She voiced understanding and will call the clinic to schedule a colposcopy with me for soon.

## 2018-08-13 ENCOUNTER — Encounter: Payer: Self-pay | Admitting: Obstetrics and Gynecology

## 2018-08-13 ENCOUNTER — Other Ambulatory Visit: Payer: Self-pay

## 2018-08-13 ENCOUNTER — Ambulatory Visit (INDEPENDENT_AMBULATORY_CARE_PROVIDER_SITE_OTHER): Payer: Managed Care, Other (non HMO) | Admitting: Obstetrics and Gynecology

## 2018-08-13 ENCOUNTER — Other Ambulatory Visit (HOSPITAL_COMMUNITY)
Admission: RE | Admit: 2018-08-13 | Discharge: 2018-08-13 | Disposition: A | Discharge status: Home / Self Care | Payer: BC Managed Care – PPO | Source: Ambulatory Visit | Attending: Obstetrics and Gynecology | Admitting: Obstetrics and Gynecology

## 2018-08-13 VITALS — BP 128/84 | Ht 66.0 in | Wt 223.0 lb

## 2018-08-13 DIAGNOSIS — R87619 Unspecified abnormal cytological findings in specimens from cervix uteri: Secondary | ICD-10-CM

## 2018-08-13 DIAGNOSIS — R87618 Other abnormal cytological findings on specimens from cervix uteri: Secondary | ICD-10-CM

## 2018-08-13 DIAGNOSIS — R8789 Other abnormal findings in specimens from female genital organs: Secondary | ICD-10-CM

## 2018-08-13 NOTE — Progress Notes (Signed)
HPI:  Kristen Phelps is a 43 y.o.  [phone]  who presents today for evaluation and management of abnormal cervical cytology.    Dysplasia History:  Cytology NILM, HPV 18+  OB History  Gravida Para Term Preterm AB Living  4 4 4     3   SAB TAB Ectopic Multiple Live Births        0 3    # Outcome Date GA Lbr Len/2nd Weight Sex Delivery Anes PTL Lv  4 Term 09/08/16 [redacted]w[redacted]d / 00:15 7 lb 13.6 oz (3.56 kg) M Vag-Spont None  LIV  3 Term 10/06/01    F Vag-Spont  N LIV  2 Term 10/14/99    M Vag-Spont  N LIV  1 Term 11/13/94   6 lb 7 oz (2.92 kg) F Vag-Spont  N LIV    Past Medical History:  Diagnosis Date  . No known health problems     Past Surgical History:  Procedure Laterality Date  . TUBAL LIGATION  2003  . tubal reversal  10/2015    SOCIAL HISTORY:  Social History   Substance and Sexual Activity  Alcohol Use Yes   Comment: Social    Social History   Substance and Sexual Activity  Drug Use No     No family history on file.  ALLERGIES:  Patient has no known allergies.  Current Outpatient Medications on File Prior to Visit  Medication Sig Dispense Refill  . ibuprofen (ADVIL,MOTRIN) 600 MG tablet Take 1 tablet (600 mg total) by mouth every 6 (six) hours. (Patient not taking: Reported on 07/05/2018) 30 tablet 0  . medroxyPROGESTERone (DEPO-PROVERA) 150 MG/ML injection Inject 1 mL (150 mg total) into the muscle every 3 (three) months. 1 mL 0  . medroxyPROGESTERone Acetate 150 MG/ML SUSY INJECT 1 ML (150 MG TOTAL) INTO THE MUSCLE EVERY 3 (THREE) MONTHS. (Patient not taking: Reported on 07/05/2018) 1 Syringe 3  . norelgestromin-ethinyl estradiol [name]) 150-35 MCG/24HR transdermal patch Place 1 patch onto the skin once a week. 3 patch 12  . Prenatal Vit-Fe Fumarate-FA (MULTIVITAMIN-PRENATAL) 27-0.8 MG TABS tablet Take 1 tablet by mouth daily at 12 noon.     No current facility-administered medications on file prior to visit.     Physical Exam: -Vitals:  BP 128/84   Ht  5\' 6"  (1.676 m)   Wt 223 lb (101.2 kg)   LMP 07/23/2018   BMI 35.99 kg/m  GEN: WD, WN, NAD.  A+ O x 3, good mood and affect. ABD:  NT, ND.  Soft, no masses.  No hernias noted.   Pelvic:   Vulva: Normal appearance.  No lesions.  Vagina: No lesions or abnormalities noted.  Support: Normal pelvic support.  Urethra No masses tenderness or scarring.  Meatus Normal size without lesions or prolapse.  Cervix: See below.  Anus: Normal exam.  No lesions.  Perineum: Normal exam.  No lesions.        Bimanual   Uterus: Normal size.  Non-tender.  Mobile.  AV.  Adnexae: No masses.  Non-tender to palpation.  Cul-de-sac: Negative for abnormality.   PROCEDURE: 1.  Urine Pregnancy Test:  negative 2.  Colposcopy performed with 4% acetic acid after verbal consent obtained                                         -Aceto-white Lesions Location(s): mildly diffusely              -  Biopsy performed at 4, 7, and 12 o'clock               -ECC indicated and performed: Yes.       -Biopsy sites made hemostatic with pressure, AgNO3, and/or Monsel's solution   -Satisfactory colposcopy: No.    -Evidence of Invasive cervical CA :  NO  ASSESSMENT:  Kristen Phelps is a 43 y.o. [phone] here for  1. Abnormal cervical Papanicolaou smear, unspecified abnormal pap finding   2. Pap smear abnormality of cervix/human papillomavirus (HPV) positive   .  PLAN: I discussed the grading system of pap smears and HPV high risk viral types.  We will discuss and base management after colpo results return.       Thomasene MohairStephen Jackson, MD  [facility], [facility] 08/13/2018  4:28 PM

## 2018-08-16 ENCOUNTER — Telehealth: Payer: Self-pay | Admitting: Obstetrics and Gynecology

## 2018-08-16 NOTE — Telephone Encounter (Signed)
Patient is calling for labs results. Please advise. 

## 2018-08-16 NOTE — Telephone Encounter (Signed)
Left generic VM to call me back 

## 2018-08-17 NOTE — Telephone Encounter (Signed)
Pt called back for results

## 2018-08-17 NOTE — Telephone Encounter (Signed)
Discussed negative biopsy results with patient. Recommend follow up pap smear in 1 year to prevent cervical cancer. She voiced understanding and agreement.

## 2018-08-22 ENCOUNTER — Other Ambulatory Visit: Payer: Self-pay | Admitting: Advanced Practice Midwife

## 2018-08-22 DIAGNOSIS — Z30013 Encounter for initial prescription of injectable contraceptive: Secondary | ICD-10-CM

## 2018-12-05 ENCOUNTER — Other Ambulatory Visit: Payer: Self-pay

## 2018-12-05 DIAGNOSIS — Z20822 Contact with and (suspected) exposure to covid-19: Secondary | ICD-10-CM

## 2018-12-08 LAB — NOVEL CORONAVIRUS, NAA: SARS-CoV-2, NAA: DETECTED — AB

## 2019-05-14 ENCOUNTER — Other Ambulatory Visit: Payer: Self-pay | Admitting: Obstetrics and Gynecology

## 2019-05-14 DIAGNOSIS — Z01419 Encounter for gynecological examination (general) (routine) without abnormal findings: Secondary | ICD-10-CM

## 2019-05-14 DIAGNOSIS — Z30016 Encounter for initial prescription of transdermal patch hormonal contraceptive device: Secondary | ICD-10-CM

## 2019-06-19 ENCOUNTER — Telehealth: Payer: Self-pay

## 2019-06-19 NOTE — Telephone Encounter (Signed)
Pls check with pharm to see if she has any RF left. Was given #1 mo with 12 RF 7/20. If not, then I'll send in addl RF to get her to annual. Thx.

## 2019-06-19 NOTE — Telephone Encounter (Signed)
Pt returned call; states [facility] send rx in for xulane patches; is out of patches; has annual scheduled 07/19/19.

## 2019-06-19 NOTE — Telephone Encounter (Signed)
Called pharmacy, spoke to Manning. Pt still has refills. He will go ahead and fill for pt. Pt aware.

## 2019-06-19 NOTE — Telephone Encounter (Signed)
Pt calling for refill of patches; has appt 7/16th; [facility].  [phone]  Left detailed msg I don't see where we changed her from depo to xulane.  Please call and clarify.  If we gave her the patches I can refill them to her appt on 7/16; if another provider gave them to her she needs to get refill from them to get her to appt.

## 2019-07-17 ENCOUNTER — Other Ambulatory Visit: Payer: Self-pay | Admitting: Obstetrics and Gynecology

## 2019-07-17 DIAGNOSIS — Z01419 Encounter for gynecological examination (general) (routine) without abnormal findings: Secondary | ICD-10-CM

## 2019-07-17 DIAGNOSIS — Z30016 Encounter for initial prescription of transdermal patch hormonal contraceptive device: Secondary | ICD-10-CM

## 2019-07-19 ENCOUNTER — Ambulatory Visit (INDEPENDENT_AMBULATORY_CARE_PROVIDER_SITE_OTHER): Payer: Managed Care, Other (non HMO) | Admitting: Obstetrics and Gynecology

## 2019-07-19 ENCOUNTER — Encounter: Payer: Self-pay | Admitting: Obstetrics and Gynecology

## 2019-07-19 ENCOUNTER — Other Ambulatory Visit: Payer: Self-pay

## 2019-07-19 ENCOUNTER — Other Ambulatory Visit (HOSPITAL_COMMUNITY)
Admission: RE | Admit: 2019-07-19 | Discharge: 2019-07-19 | Disposition: A | Discharge status: Home / Self Care | Payer: BC Managed Care – PPO | Source: Ambulatory Visit | Attending: Obstetrics and Gynecology | Admitting: Obstetrics and Gynecology

## 2019-07-19 VITALS — BP 122/74 | Ht 66.0 in | Wt 228.0 lb

## 2019-07-19 DIAGNOSIS — Z01419 Encounter for gynecological examination (general) (routine) without abnormal findings: Secondary | ICD-10-CM | POA: Insufficient documentation

## 2019-07-19 DIAGNOSIS — Z3045 Encounter for surveillance of transdermal patch hormonal contraceptive device: Secondary | ICD-10-CM

## 2019-07-19 DIAGNOSIS — Z30016 Encounter for initial prescription of transdermal patch hormonal contraceptive device: Secondary | ICD-10-CM | POA: Diagnosis not present

## 2019-07-19 DIAGNOSIS — Z1331 Encounter for screening for depression: Secondary | ICD-10-CM | POA: Diagnosis not present

## 2019-07-19 DIAGNOSIS — R8789 Other abnormal findings in specimens from female genital organs: Secondary | ICD-10-CM | POA: Insufficient documentation

## 2019-07-19 DIAGNOSIS — R87618 Other abnormal cytological findings on specimens from cervix uteri: Secondary | ICD-10-CM

## 2019-07-19 DIAGNOSIS — Z1339 Encounter for screening examination for other mental health and behavioral disorders: Secondary | ICD-10-CM

## 2019-07-19 MED ORDER — XULANE 150-35 MCG/24HR TD PTWK: 1.0000 | MEDICATED_PATCH | TRANSDERMAL | 4 refills | Status: DC

## 2019-07-19 NOTE — Progress Notes (Signed)
Gynecology Annual Exam  PCP: Patient, No Pcp Per  Chief Complaint  Patient presents with  . Annual Exam   History of Present Illness:  Ms. Kristen Phelps is a 44 y.o. [phone] who LMP was No LMP recorded. (Menstrual status: Other)., presents today for her annual examination.  Her menses are present coming monthly, lasting 2 days, no intermenstrual. Not painful nor heavy.    She is sexually active. She does not have vaginal dryness.  Last Pap: 1 year  Results were: no abnormalities /neg HPV DNA positive HPV 18.  Hx of STDs: none  Last mammogram: never had.    There is no FH of breast cancer. There is no FH of ovarian cancer. The patient does not do self-breast exams.  Colonoscopy: never had DEXA: has not been screened for osteoporosis  Tobacco use: former smoker, quit 4 years ago. Alcohol use: social drinker Exercise: doing a lot of work around the house.  The patient wears seatbelts: yes.     Past Medical History:  Diagnosis Date  . No known health problems     Past Surgical History:  Procedure Laterality Date  . TUBAL LIGATION  2003  . tubal reversal  10/2015    Prior to Admission medications   Medication Sig Start Date End Date Taking? Authorizing Provider  [facility] [employer] 150-35 MCG/24HR transdermal patch APPLY 1 PATCH ONCE A WEEK 07/17/19  Yes Conard Novak, MD   Allergies: No Known Allergies  Obstetric History: [id number]  Family History  Problem Relation Age of Onset  . Hypertension Father   . Hypertension Mother   . Hypertension Sister   . Breast cancer Neg Hx   . Colon cancer Neg Hx   . Ovarian cancer Neg Hx     Social History   Socioeconomic History  . Marital status: Single    Spouse name: Not on file  . Number of children: Not on file  . Years of education: Not on file  . Highest education level: Not on file  Occupational History  . Not on file  Tobacco Use  . Smoking status: Former Games developer  . Smokeless tobacco: Never Used  Vaping Use  .  Vaping Use: Never used  Substance and Sexual Activity  . Alcohol use: Yes    Comment: Social  . Drug use: No  . Sexual activity: Yes    Birth control/protection: Patch  Other Topics Concern  . Not on file  Social History Narrative  . Not on file   Social Determinants of Health   Financial Resource Strain:   . Difficulty of Paying Living Expenses:   Food Insecurity:   . Worried About Programme researcher, broadcasting/film/video in the Last Year:   . Barista in the Last Year:   Transportation Needs:   . Freight forwarder (Medical):   Marland Kitchen Lack of Transportation (Non-Medical):   Physical Activity:   . Days of Exercise per Week:   . Minutes of Exercise per Session:   Stress:   . Feeling of Stress :   Social Connections:   . Frequency of Communication with Friends and Family:   . Frequency of Social Gatherings with Friends and Family:   . Attends Religious Services:   . Active Member of Clubs or Organizations:   . Attends Banker Meetings:   Marland Kitchen Marital Status:   Intimate Partner Violence:   . Fear of Current or Ex-Partner:   . Emotionally Abused:   Marland Kitchen Physically Abused:   .  Sexually Abused:     Review of Systems  Constitutional: Negative.   HENT: Negative.   Eyes: Negative.   Respiratory: Negative.   Cardiovascular: Negative.   Gastrointestinal: Negative.   Genitourinary: Negative.   Musculoskeletal: Negative.   Skin: Negative.   Neurological: Negative.   Psychiatric/Behavioral: Negative.       Physical Exam BP 122/74   Ht 5\' 6"  (1.676 m)   Wt 228 lb (103.4 kg)   BMI 36.80 kg/m   Physical Exam Constitutional:      General: She is not in acute distress.    Appearance: Normal appearance. She is well-developed.  Genitourinary:     Pelvic exam was performed with patient in the lithotomy position.     Vulva, urethra, bladder and uterus normal.     No inguinal adenopathy present in the right or left side.    No signs of injury in the vagina.     No vaginal  discharge, erythema, tenderness or bleeding.     No cervical motion tenderness, discharge, lesion or polyp.     Uterus is mobile.     Uterus is not enlarged or tender.     No uterine mass detected.    Uterus is anteverted.     No right or left adnexal mass present.     Right adnexa not tender or full.     Left adnexa not tender or full.  HENT:     Head: Normocephalic and atraumatic.  Eyes:     General: No scleral icterus.    Conjunctiva/sclera: Conjunctivae normal.  Neck:     Thyroid: No thyromegaly.  Cardiovascular:     Rate and Rhythm: Normal rate and regular rhythm.     Heart sounds: No murmur heard.  No friction rub. No gallop.   Pulmonary:     Effort: Pulmonary effort is normal. No respiratory distress.     Breath sounds: Normal breath sounds. No wheezing or rales.  Chest:     Breasts:        Right: No inverted nipple, mass, nipple discharge, skin change or tenderness.        Left: No inverted nipple, mass, nipple discharge, skin change or tenderness.  Abdominal:     General: Bowel sounds are normal. There is no distension.     Palpations: Abdomen is soft. There is no mass.     Tenderness: There is no abdominal tenderness. There is no guarding or rebound.  Musculoskeletal:        General: No swelling or tenderness. Normal range of motion.     Cervical back: Normal range of motion and neck supple.  Lymphadenopathy:     Cervical: No cervical adenopathy.     Lower Body: No right inguinal adenopathy. No left inguinal adenopathy.  Neurological:     General: No focal deficit present.     Mental Status: She is alert and oriented to person, place, and time.     Cranial Nerves: No cranial nerve deficit.  Skin:    General: Skin is warm and dry.     Findings: No erythema or rash.  Psychiatric:        Mood and Affect: Mood normal.        Behavior: Behavior normal.        Judgment: Judgment normal.     Female chaperone present for pelvic and breast  portions of the physical  exam  Results: AUDIT Questionnaire (screen for alcoholism): 3 PHQ-9: 2  Assessment: 44 y.o. 49  here for routine gynecologic annual examination  Plan: Problem List Items Addressed This Visit    None    Visit Diagnoses    Women's annual routine gynecological examination    -  Primary   Relevant Medications   norelgestromin-ethinyl estradiol [facility]) 150-35 MCG/24HR transdermal patch   Other Relevant Orders   Cytology - PAP   Screening for depression       Screening for alcoholism       Pap smear abnormality of cervix/human papillomavirus (HPV) positive       Relevant Orders   Cytology - PAP   Encounter for surveillance of transdermal patch hormonal contraceptive device       Relevant Medications   norelgestromin-ethinyl estradiol [facility]) 150-35 MCG/24HR transdermal patch   Encounter for initial prescription of transdermal patch hormonal contraceptive device          Screening: -- Blood pressure screen normal -- Colonoscopy - not due -- Mammogram - due. Patient to call [facility] to arrange. She understands that it is her responsibility to arrange this. -- Weight screening: obese: discussed management options, including lifestyle, dietary, and exercise. -- Depression screening negative (PHQ-9) -- Nutrition: normal -- cholesterol screening: not due for screening -- osteoporosis screening: not due -- tobacco screening: not using -- alcohol screening: AUDIT questionnaire indicates low-risk usage. -- family history of breast cancer screening: done. not at high risk. -- no evidence of domestic violence or intimate partner violence. -- STD screening: gonorrhea/chlamydia NAAT not collected per patient request. -- pap smear collected per [facility]CCP guidelines  Thomasene Mohair, MD 07/19/2019 9:18 AM

## 2019-07-25 LAB — CYTOLOGY - PAP
Comment: NEGATIVE
Diagnosis: NEGATIVE
Diagnosis: REACTIVE
High risk HPV: NEGATIVE

## 2019-08-10 ENCOUNTER — Other Ambulatory Visit: Payer: Self-pay | Admitting: Obstetrics and Gynecology

## 2019-08-10 DIAGNOSIS — Z3045 Encounter for surveillance of transdermal patch hormonal contraceptive device: Secondary | ICD-10-CM

## 2019-08-10 DIAGNOSIS — Z01419 Encounter for gynecological examination (general) (routine) without abnormal findings: Secondary | ICD-10-CM

## 2019-12-12 ENCOUNTER — Telehealth: Payer: Self-pay

## 2019-12-12 NOTE — Telephone Encounter (Signed)
Pt calling for refill of bc patch; unable to request thru MyChart.  [phone]

## 2019-12-13 NOTE — Telephone Encounter (Signed)
Pt aware.  Number for [employer] given.

## 2020-06-25 ENCOUNTER — Other Ambulatory Visit: Payer: Self-pay | Admitting: Obstetrics and Gynecology

## 2020-06-25 DIAGNOSIS — Z01419 Encounter for gynecological examination (general) (routine) without abnormal findings: Secondary | ICD-10-CM

## 2020-06-25 DIAGNOSIS — Z3045 Encounter for surveillance of transdermal patch hormonal contraceptive device: Secondary | ICD-10-CM

## 2020-09-13 ENCOUNTER — Other Ambulatory Visit: Payer: Self-pay | Admitting: Obstetrics and Gynecology

## 2020-09-13 DIAGNOSIS — Z01419 Encounter for gynecological examination (general) (routine) without abnormal findings: Secondary | ICD-10-CM

## 2020-09-13 DIAGNOSIS — Z3045 Encounter for surveillance of transdermal patch hormonal contraceptive device: Secondary | ICD-10-CM
